# Patient Record
Sex: Female | Born: 1985 | Race: White | Hispanic: No | Marital: Married | Smoking: Never smoker
Health system: Southern US, Community
[De-identification: ages and names within clinical notes are randomized; demographics above are authoritative.]

## PROBLEM LIST (undated history)

## (undated) DIAGNOSIS — D229 Melanocytic nevi, unspecified: Secondary | ICD-10-CM

## (undated) DIAGNOSIS — K5900 Constipation, unspecified: Secondary | ICD-10-CM

## (undated) HISTORY — DX: Melanocytic nevi, unspecified: D22.9

## (undated) HISTORY — DX: Constipation, unspecified: K59.00

## (undated) HISTORY — PX: OTHER SURGICAL HISTORY: SHX169

---

## 2012-06-13 ENCOUNTER — Inpatient Hospital Stay: Payer: Self-pay | Admitting: Obstetrics and Gynecology

## 2012-06-14 LAB — CBC WITH DIFFERENTIAL/PLATELET
Basophil %: 1 %
Eosinophil #: 0 10*3/uL (ref 0.0–0.7)
HCT: 33.3 % — ABNORMAL LOW (ref 35.0–47.0)
HGB: 11.3 g/dL — ABNORMAL LOW (ref 12.0–16.0)
Lymphocyte #: 2 10*3/uL (ref 1.0–3.6)
Lymphocyte %: 23.1 %
MCH: 32.4 pg (ref 26.0–34.0)
MCV: 96 fL (ref 80–100)
Monocyte %: 6.1 %
Neutrophil %: 69.3 %
RDW: 14.1 % (ref 11.5–14.5)
WBC: 8.7 10*3/uL (ref 3.6–11.0)

## 2012-06-15 LAB — HEMATOCRIT: HCT: 27.2 % — ABNORMAL LOW (ref 35.0–47.0)

## 2014-03-06 LAB — HM PAP SMEAR: HM PAP: NEGATIVE

## 2014-10-16 NOTE — H&P (Signed)
L&D Evaluation:  History:   HPI 29 yo wmf G1Po at 40.[redacted] weeks gestation.    Presents with contractions    Patient's Medical History No Chronic Illness    Patient's Surgical History none    Medications Pre [place] Vitamins    Social History none    Family History Non-Contributory   ROS:   ROS All systems were reviewed.  HEENT, CNS, GI, GU, Respiratory, CV, Renal and Musculoskeletal systems were found to be normal.   Exam:   Vital Signs stable    Urine Protein negative dipstick    General no apparent distress    Mental Status clear    Heart normal sinus rhythm    Abdomen gravid, non-tender    Estimated Fetal Weight Average for gestational age, 8#4    Fundal Height 38    Back no CVAT    Edema 1+    Pelvic no external lesions, 4-5/80/0/vtx/BOWI    Mebranes Intact    FHT normal rate with no decels, NST Reactive    Ucx irregular, reguar after cytotec    Skin dry    Lymph no lymphadenopathy   Impression:   Impression early labor, 40.1 week IUP   Plan:   Plan EFM/NST, monitor contractions and for cervical change, Cytotec/Pitocin IOL    Comments Epidural prn   Electronic Signatures: Glenisha Gundry, Alanda Slim (MD)  (Signed 07-Jan-14 08:35)  Authored: L&D Evaluation   Last Updated: 07-Jan-14 08:35 by Clotile Whittington, Alanda Slim (MD)

## 2015-03-13 ENCOUNTER — Encounter: Payer: Self-pay | Admitting: Obstetrics and Gynecology

## 2015-03-13 ENCOUNTER — Ambulatory Visit (INDEPENDENT_AMBULATORY_CARE_PROVIDER_SITE_OTHER): Payer: BLUE CROSS/BLUE SHIELD | Admitting: Obstetrics and Gynecology

## 2015-03-13 VITALS — BP 110/71 | HR 78 | Ht 68.0 in | Wt 181.5 lb

## 2015-03-13 DIAGNOSIS — Z01419 Encounter for gynecological examination (general) (routine) without abnormal findings: Secondary | ICD-10-CM | POA: Diagnosis not present

## 2015-03-13 NOTE — Progress Notes (Signed)
Patient ID: Cheryl Meyer, female   DOB: 1985/07/15, 29 y.o.   MRN: [id number] ANNUAL PREVENTATIVE CARE GYN  ENCOUNTER NOTE  Subjective:       Cheryl Meyer is a 29 y.o. No obstetric history on file. female here for a routine annual gynecologic exam.  Current complaints: 1.  none  2.  Attempting conception   Gynecologic History Patient's last menstrual period was 02/12/2015 (exact date). Contraception: none Last Pap: 03/06/2014 NEG/NEG. Results were: normal Last mammogram: n/a. Results were: n/a  Obstetric History OB History  No data available    Past Medical History  Diagnosis Date  . Numerous moles   . Constipation     Past Surgical History  Procedure Laterality Date  . None      No current outpatient prescriptions on file prior to visit.   No current facility-administered medications on file prior to visit.    No Known Allergies  Social History   Social History  . Marital Status: Married    Spouse Name: N/A  . Number of Children: N/A  . Years of Education: N/A   Occupational History  . Not on file.   Social History Main Topics  . Smoking status: Never Smoker   . Smokeless tobacco: Not on file  . Alcohol Use: Yes     Comment: occas- once a week  . Drug Use: No  . Sexual Activity: Yes    Birth Control/ Protection: None   Other Topics Concern  . Not on file   Social History Narrative  . No narrative on file    Family History  Problem Relation Age of Onset  . Breast cancer Mother   . Diabetes Father   . Breast cancer Maternal Aunt   . Heart disease Maternal Grandfather   . Colon cancer Neg Hx   . Ovarian cancer Neg Hx     The following portions of the patient's history were reviewed and updated as appropriate: allergies, current medications, past family history, past medical history, past social history, past surgical history and problem list.  Review of Systems ROS Review of Systems - General ROS: negative for - chills, fatigue,  fever, hot flashes, night sweats, weight gain or weight loss Psychological ROS: negative for - anxiety, decreased libido, depression, mood swings, physical abuse or sexual abuse Ophthalmic ROS: negative for - blurry vision, eye pain or loss of vision ENT ROS: negative for - headaches, hearing change, visual changes or vocal changes Allergy and Immunology ROS: negative for - hives, itchy/watery eyes or seasonal allergies Hematological and Lymphatic ROS: negative for - bleeding problems, bruising, swollen lymph nodes or weight loss Endocrine ROS: negative for - galactorrhea, hair pattern changes, hot flashes, malaise/lethargy, mood swings, palpitations, polydipsia/polyuria, skin changes, temperature intolerance or unexpected weight changes Breast ROS: negative for - new or changing breast lumps or nipple discharge Respiratory ROS: negative for - cough or shortness of breath Cardiovascular ROS: negative for - chest pain, irregular heartbeat, palpitations or shortness of breath Gastrointestinal ROS: no abdominal pain, change in bowel habits, or black or bloody stools Genito-Urinary ROS: no dysuria, trouble voiding, or hematuria Musculoskeletal ROS: negative for - joint pain or joint stiffness Neurological ROS: negative for - bowel and bladder control changes Dermatological ROS: negative for rash and skin lesion changes   Objective:   BP 110/71 mmHg  Pulse 78  Ht 5\' 8"  (1.727 m)  Wt 181 lb 8 oz (82.328 kg)  BMI 27.60 kg/m2  LMP 02/12/2015 (Exact Date) CONSTITUTIONAL:  Well-developed, well-nourished female in no acute distress.  PSYCHIATRIC: Normal mood and affect. Normal behavior. Normal judgment and thought content. [place]: Alert and oriented to person, place, and time. Normal muscle tone coordination. No cranial nerve deficit noted. HENT:  Normocephalic, atraumatic, External right and left ear normal. Oropharynx is clear and moist EYES: Conjunctivae and EOM are normal. Pupils are equal,  round, and reactive to light. No scleral icterus.  NECK: Normal range of motion, supple, no masses.  Normal thyroid.  SKIN: Skin is warm and dry. No rash noted. Not diaphoretic. No erythema. No pallor. CARDIOVASCULAR: Normal heart rate noted, regular rhythm, no murmur. RESPIRATORY: Clear to auscultation bilaterally. Effort and breath sounds normal, no problems with respiration noted. BREASTS: Symmetric in size. No masses, skin changes, nipple drainage, or lymphadenopathy. ABDOMEN: Soft, normal bowel sounds, no distention noted.  No tenderness, rebound or guarding.  BLADDER: Normal PELVIC:  External Genitalia: Normal  BUS: Normal  Vagina: Normal  Cervix: Normal  Uterus: Normal; retroverted; normal size, shape  Adnexa: Normal  RV: External Exam NormaI  MUSCULOSKELETAL: Normal range of motion. No tenderness.  No cyanosis, clubbing, or edema.  2+ distal pulses. LYMPHATIC: No Axillary, Supraclavicular, or Inguinal Adenopathy.    Assessment:   Annual gynecologic examination 29 y.o. Contraception: none Overweight Problem List Items Addressed This Visit    None      Plan:  Pap: Not needed Mammogram: Not Indicated Stool Guaiac Testing:  Not Indicated Labs: lipid vit d tsh fbs a1c Routine preventative health maintenance measures emphasized: Exercise/Diet/Weight control, Tobacco Warnings and Alcohol/Substance use risks Continue prenatal vitamins; preconception counseling done.  Exercise/Diet/Weight control, Tobacco Warnings and Alcohol/Substance use risks discussed. Return to [place], [place]  Brayton Mars, MD

## 2015-03-13 NOTE — Patient Instructions (Signed)
1.  Pap smear not needed. 2.  Mammogram not needed. 3.  Continue with prenatal vitamins. 4.  Preconception counseling regarding healthy eating, exercise, avoidance of alcohol/tobacco/drugs. 5.  Return in 1 year or as needed

## 2015-03-14 LAB — GLUCOSE, RANDOM: Glucose: 84 mg/dL (ref 65–99)

## 2015-03-14 LAB — TSH: TSH: 2.86 u[IU]/mL (ref 0.450–4.500)

## 2015-03-14 LAB — HEMOGLOBIN A1C
Est. average glucose Bld gHb Est-mCnc: 108 mg/dL
Hgb A1c MFr Bld: 5.4 % (ref 4.8–5.6)

## 2015-03-14 LAB — LIPID PANEL
CHOL/HDL RATIO: 3.2 ratio (ref 0.0–4.4)
Cholesterol, Total: 165 mg/dL (ref 100–199)
HDL: 52 mg/dL (ref >39)
LDL CALC: 100 mg/dL — AB (ref 0–99)
Triglycerides: 67 mg/dL (ref 0–149)
VLDL CHOLESTEROL CAL: 13 mg/dL (ref 5–40)

## 2015-03-14 LAB — VITAMIN D 25 HYDROXY (VIT D DEFICIENCY, FRACTURES): VIT D 25 HYDROXY: 30.7 ng/mL (ref 30.0–100.0)

## 2015-06-09 NOTE — L&D Delivery Note (Signed)
Delivery Note At  1459 a viable and healthy female was delivered via  (Presentation:OA ;  ).  APGAR:8 ,9 ; weight  .   Placenta status: delivered intact with 3 vessel  Cord:  with the following complications: Logan [id number] reduced on perineum   Anesthesia:  epidural Episiotomy:  midline Lacerations:  none Suture Repair: 3.0 vicryl rapide Est. Blood Loss (mL):  300  Mom to postpartum.  Baby to Couplet care / Skin to Skin.  [place], CNM 02/10/2016, 3:14 PM

## 2015-06-21 ENCOUNTER — Encounter: Payer: Self-pay | Admitting: Obstetrics and Gynecology

## 2015-06-21 ENCOUNTER — Ambulatory Visit (INDEPENDENT_AMBULATORY_CARE_PROVIDER_SITE_OTHER): Payer: BLUE CROSS/BLUE SHIELD | Admitting: Obstetrics and Gynecology

## 2015-06-21 VITALS — BP 138/78 | HR 80 | Ht 68.0 in | Wt 185.3 lb

## 2015-06-21 DIAGNOSIS — N926 Irregular menstruation, unspecified: Secondary | ICD-10-CM

## 2015-06-21 LAB — POCT URINE PREGNANCY: Preg Test, Ur: POSITIVE — AB

## 2015-06-21 NOTE — Patient Instructions (Signed)
Thank you for enrolling in [facility]. Please follow the instructions below to securely access your online medical record. MyChart allows you to send messages to your doctor, view your test results, renew your prescriptions, schedule appointments, and more.  How Do I Sign Up? 1. In your Internet browser, go to [email or web address]. 2. Click on the New  User? link in the Sign In box.  3. Enter your MyChart Access Code exactly as it appears below. You will not need to use this code after you have completed the sign-up process. If you do not sign up before the expiration date, you must request a new code. MyChart Access Code: [email or web address] Expires: 08/17/2015  2:19 PM  4. Enter the last four digits of your Social Security Number (xxxx) and Date of Birth (mm/dd/yyyy) as indicated and click Next. You will be taken to the next sign-up page. 5. Create a MyChart ID. This will be your MyChart login ID and cannot be changed, so think of one that is secure and easy to remember. 6. Create a MyChart password. You can change your password at any time. 7. Enter your Password Reset Question and Answer and click Next. This can be used at a later time if you forget your password.  8. Select your communication preference, and if applicable enter your e-mail address. You will receive e-mail notification when new information is available in MyChart by choosing to receive e-mail notifications and filling in your e-mail. 9. Click Sign In. You can now view your medical record.   Additional Information If you have questions, you can email [email or web address] or call [phone] to talk to our [facility] staff. Remember, MyChart is NOT to be used for urgent needs. For medical emergencies, dial 911.   First Trimester of Pregnancy The first trimester of pregnancy is from week 1 until the end of week 12 (months 1 through 3). A week after a sperm fertilizes an egg, the egg will implant on the wall  of the uterus. This embryo will begin to develop into a baby. Genes from you and your partner are forming the baby. The female genes determine whether the baby is a boy or a girl. At 6-8 weeks, the eyes and face are formed, and the heartbeat can be seen on ultrasound. At the end of 12 weeks, all the baby's organs are formed.  Now that you are pregnant, you will want to do everything you can to have a healthy baby. Two of the most important things are to get good prenatal care and to follow your health care provider's instructions. Prenatal care is all the medical care you receive before the baby's birth. This care will help prevent, find, and treat any problems during the pregnancy and childbirth. BODY CHANGES Your body goes through many changes during pregnancy. The changes vary from woman to woman.   You may gain or lose a couple of pounds at first.  You may feel sick to your stomach (nauseous) and throw up (vomit). If the vomiting is uncontrollable, call your health care provider.  You may tire easily.  You may develop headaches that can be relieved by medicines approved by your health care provider.  You may urinate more often. Painful urination may mean you have a bladder infection.  You may develop heartburn as a result of your pregnancy.  You may develop constipation because certain hormones are causing the muscles that push waste through your intestines to slow down.  You may develop  hemorrhoids or swollen, bulging veins (varicose veins).  Your breasts may begin to grow larger and become tender. Your nipples may stick out more, and the tissue that surrounds them (areola) may become darker.  Your gums may bleed and may be sensitive to brushing and flossing.  Dark spots or blotches (chloasma, mask of pregnancy) may develop on your face. This will likely fade after the baby is born.  Your menstrual periods will stop.  You may have a loss of appetite.  You may develop cravings for  certain kinds of food.  You may have changes in your emotions from day to day, such as being excited to be pregnant or being concerned that something may go wrong with the pregnancy and baby.  You may have more vivid and strange dreams.  You may have changes in your hair. These can include thickening of your hair, rapid growth, and changes in texture. Some women also have hair loss during or after pregnancy, or hair that feels dry or thin. Your hair will most likely return to normal after your baby is born. WHAT TO EXPECT AT YOUR PRENATAL VISITS During a routine prenatal visit:  You will be weighed to make sure you and the baby are growing normally.  Your blood pressure will be taken.  Your abdomen will be measured to track your baby's growth.  The fetal heartbeat will be listened to starting around week 10 or 12 of your pregnancy.  Test results from any previous visits will be discussed. Your health care provider may ask you:  How you are feeling.  If you are feeling the baby move.  If you have had any abnormal symptoms, such as leaking fluid, bleeding, severe headaches, or abdominal cramping.  If you are using any tobacco products, including cigarettes, chewing tobacco, and electronic cigarettes.  If you have any questions. Other tests that may be performed during your first trimester include:  Blood tests to find your blood type and to check for the presence of any previous infections. They will also be used to check for low iron levels (anemia) and Rh antibodies. Later in the pregnancy, blood tests for diabetes will be done along with other tests if problems develop.  Urine tests to check for infections, diabetes, or protein in the urine.  An ultrasound to confirm the proper growth and development of the baby.  An amniocentesis to check for possible genetic problems.  Fetal screens for spina bifida and Down syndrome.  You may need other tests to make sure you and the  baby are doing well.  HIV (human immunodeficiency virus) testing. Routine prenatal testing includes screening for HIV, unless you choose not to have this test. HOME CARE INSTRUCTIONS  Medicines  Follow your health care provider's instructions regarding medicine use. Specific medicines may be either safe or unsafe to take during pregnancy.  Take your prenatal vitamins as directed.  If you develop constipation, try taking a stool softener if your health care provider approves. Diet  Eat regular, well-balanced meals. Choose a variety of foods, such as meat or vegetable-based protein, fish, milk and low-fat dairy products, vegetables, fruits, and whole grain breads and cereals. Your health care provider will help you determine the amount of weight gain that is right for you.  Avoid raw meat and uncooked cheese. These carry germs that can cause birth defects in the baby.  Eating four or five small meals rather than three large meals a day may help relieve nausea and vomiting. If  you start to feel nauseous, eating a few soda crackers can be helpful. Drinking liquids between meals instead of during meals also seems to help nausea and vomiting.  If you develop constipation, eat more high-fiber foods, such as fresh vegetables or fruit and whole grains. Drink enough fluids to keep your urine clear or pale yellow. Activity and Exercise  Exercise only as directed by your health care provider. Exercising will help you:  Control your weight.  Stay in shape.  Be prepared for labor and delivery.  Experiencing pain or cramping in the lower abdomen or low back is a good sign that you should stop exercising. Check with your health care provider before continuing normal exercises.  Try to avoid standing for long periods of time. Move your legs often if you must stand in one place for a long time.  Avoid heavy lifting.  Wear low-heeled shoes, and practice good posture.  You may continue to have sex  unless your health care provider directs you otherwise. Relief of Pain or Discomfort  Wear a good support bra for breast tenderness.   Take warm sitz baths to soothe any pain or discomfort caused by hemorrhoids. Use hemorrhoid cream if your health care provider approves.   Rest with your legs elevated if you have leg cramps or low back pain.  If you develop varicose veins in your legs, wear support hose. Elevate your feet for 15 minutes, 3-4 times a day. Limit salt in your diet. Prenatal Care  Schedule your prenatal visits by the twelfth week of pregnancy. They are usually scheduled monthly at first, then more often in the last 2 months before delivery.  Write down your questions. Take them to your prenatal visits.  Keep all your prenatal visits as directed by your health care provider. Safety  Wear your seat belt at all times when driving.  Make a list of emergency phone numbers, including numbers for family, friends, the hospital, and police and fire departments. General Tips  Ask your health care provider for a referral to a local prenatal education class. Begin classes no later than at the beginning of month 6 of your pregnancy.  Ask for help if you have counseling or nutritional needs during pregnancy. Your health care provider can offer advice or refer you to specialists for help with various needs.  Do not use hot tubs, steam rooms, or saunas.  Do not douche or use tampons or scented sanitary pads.  Do not cross your legs for long periods of time.  Avoid cat litter boxes and soil used by cats. These carry germs that can cause birth defects in the baby and possibly loss of the fetus by miscarriage or stillbirth.  Avoid all smoking, herbs, alcohol, and medicines not prescribed by your health care provider. Chemicals in these affect the formation and growth of the baby.  Do not use any tobacco products, including cigarettes, chewing tobacco, and electronic cigarettes. If  you need help quitting, ask your health care provider. You may receive counseling support and other resources to help you quit.  Schedule a dentist appointment. At home, brush your teeth with a soft toothbrush and be gentle when you floss. SEEK MEDICAL CARE IF:   You have dizziness.  You have mild pelvic cramps, pelvic pressure, or nagging pain in the abdominal area.  You have persistent nausea, vomiting, or diarrhea.  You have a bad smelling vaginal discharge.  You have pain with urination.  You notice increased swelling in your face,  hands, legs, or ankles. SEEK IMMEDIATE MEDICAL CARE IF:   You have a fever.  You are leaking fluid from your vagina.  You have spotting or bleeding from your vagina.  You have severe abdominal cramping or pain.  You have rapid weight gain or loss.  You vomit blood or material that looks like coffee grounds.  You are exposed to [place] measles and have never had them.  You are exposed to fifth disease or chickenpox.  You develop a severe headache.  You have shortness of breath.  You have any kind of trauma, such as from a fall or a car accident.   This information is not intended to replace advice given to you by your health care provider. Make sure you discuss any questions you have with your health care provider.   Document Released: 05/19/2001 Document Revised: 06/15/2014 Document Reviewed: 04/04/2013 Elsevier Interactive Patient Education [employer].

## 2015-06-21 NOTE — Progress Notes (Signed)
Patient ID: Cheryl Meyer, female   DOB: 1986-04-26, 30 y.o.   MRN: [id number]   Here for pregnancy confirmation: LMP 05/27/15 with [facility] 02/18/16 and EGA [redacted]w[redacted]d  Denies any nausea, happy about planned pregnancy. G2P1001 with normal previous pregnancy and delivery.  A: missed menses with +UPT  P: viability scan in 3 weeks with Nurse intake and labs.  NOB PE in 6 weeks.  Cristen Murcia Carver, CNM

## 2015-06-24 ENCOUNTER — Encounter: Payer: Self-pay | Admitting: Obstetrics and Gynecology

## 2015-07-19 ENCOUNTER — Ambulatory Visit (INDEPENDENT_AMBULATORY_CARE_PROVIDER_SITE_OTHER): Payer: BLUE CROSS/BLUE SHIELD | Admitting: Obstetrics and Gynecology

## 2015-07-19 ENCOUNTER — Ambulatory Visit (INDEPENDENT_AMBULATORY_CARE_PROVIDER_SITE_OTHER): Payer: BLUE CROSS/BLUE SHIELD

## 2015-07-19 VITALS — BP 122/82 | HR 70 | Wt 185.1 lb

## 2015-07-19 DIAGNOSIS — Z3490 Encounter for supervision of normal pregnancy, unspecified, unspecified trimester: Secondary | ICD-10-CM

## 2015-07-19 DIAGNOSIS — N926 Irregular menstruation, unspecified: Secondary | ICD-10-CM

## 2015-07-19 DIAGNOSIS — Z331 Pregnant state, incidental: Secondary | ICD-10-CM

## 2015-07-19 DIAGNOSIS — Z36 Encounter for antenatal screening of mother: Secondary | ICD-10-CM

## 2015-07-19 DIAGNOSIS — Z113 Encounter for screening for infections with a predominantly sexual mode of transmission: Secondary | ICD-10-CM

## 2015-07-19 DIAGNOSIS — Z1389 Encounter for screening for other disorder: Secondary | ICD-10-CM

## 2015-07-19 DIAGNOSIS — R638 Other symptoms and signs concerning food and fluid intake: Secondary | ICD-10-CM

## 2015-07-19 DIAGNOSIS — Z369 Encounter for antenatal screening, unspecified: Secondary | ICD-10-CM

## 2015-07-19 NOTE — Progress Notes (Signed)
  Cheryl Meyer presents for NOB nurse interview visit. G-2.  P-1001. Pregnancy education material explained and given. No cats in the home. NOB labs ordered. TSH/HbgA1c due to Increased BMI, HIV labs and Drug screen were explained optional and she could opt out of tests but did not decline. Drug screen ordered. PNV encouraged. NT to discuss with provider. Pt. To follow up with provider in 3 weeks for NOB physical.  All questions answered.  ZIKA EXPOSURE SCREEN:  The patient has not traveled to a [place] Virus endemic area within the past 6 months, nor has she had unprotected sex with a partner who has travelled to a [place] endemic region within the past 6 months. The patient has been advised to notify us if these factors change any time during this current pregnancy, so adequate testing and monitoring can be initiated.

## 2015-07-19 NOTE — Patient Instructions (Signed)
Pregnancy and Zika Virus Disease Zika virus disease, or Zika, is an illness that can spread to people from mosquitoes that carry the virus. It may also spread from person to person through infected body fluids. Zika first occurred in [place], but recently it has spread to new areas. The virus occurs in tropical climates. The location of Zika continues to change. Most people who become infected with Zika virus do not develop serious illness. However, Zika may cause birth defects in an unborn baby whose mother is infected with the virus. It may also increase the risk of miscarriage. WHAT ARE THE SYMPTOMS OF ZIKA VIRUS DISEASE? In many cases, people who have been infected with Zika virus do not develop any symptoms. If symptoms appear, they usually start about a week after the person is infected. Symptoms are usually mild. They may include:  Fever.  Rash.  Red eyes.  Joint pain. HOW DOES ZIKA VIRUS DISEASE SPREAD? The main way that Zika virus spreads is through the bite of a certain type of mosquito. Unlike most types of mosquitos, which bite only at night, the type of mosquito that carries Zika virus bites both at night and during the day. Zika virus can also spread through sexual contact, through a blood transfusion, and from a mother to her baby before or during birth. Once you have had Zika virus disease, it is unlikely that you will get it again. CAN I PASS ZIKA TO MY BABY DURING PREGNANCY? Yes, Zika can pass from a mother to her baby before or during birth. WHAT PROBLEMS CAN ZIKA CAUSE FOR MY BABY? A woman who is infected with Zika virus while pregnant is at risk of having her baby born with a condition in which the brain or head is smaller than expected (microcephaly). Babies who have microcephaly can have developmental delays, seizures, hearing problems, and vision problems. Having Zika virus disease during pregnancy can also increase the risk of miscarriage. HOW CAN ZIKA VIRUS DISEASE BE  PREVENTED? There is no vaccine to prevent Zika. The best way to prevent the disease is to avoid infected mosquitoes and avoid exposure to body fluids that can spread the virus. Avoid any possible exposure to Zika by taking the following precautions. For women and their sex partners:  Avoid traveling to high-risk areas. The locations where Zika is being reported change often. To identify high-risk areas, check the CDC travel website: www.cdc.gov/zika/geo/index.html  If you or your sex partner must travel to a high-risk area, talk with a health care provider before and after traveling.  Take all precautions to avoid mosquito bites if you live in, or travel to, any of the high-risk areas. Insect repellents are safe to use during pregnancy.  Ask your health care provider when it is safe to have sexual contact. For women:  If you are pregnant or trying to become pregnant, avoid sexual contact with persons who may have been exposed to Zika virus, persons who have possible symptoms of Zika, or persons whose history you are unsure about. If you choose to have sexual contact with someone who may have been exposed to Zika virus, use condoms correctly during the entire duration of sexual activity, every time. Do not share sexual devices, as you may be exposed to body fluids.  Ask your health care provider about when it is safe to attempt pregnancy after a possible exposure to Zika virus. WHAT STEPS SHOULD I TAKE TO AVOID MOSQUITO BITES? Take these steps to avoid mosquito bites when you are   in a high-risk area:  Wear loose clothing that covers your arms and legs.  Limit your outdoor activities.  Do not open windows unless they have window screens.  Sleep under mosquito nets.  Use insect repellent. The best insect repellents have:  DEET, picaridin, oil of lemon eucalyptus (OLE), or IR3535 in them.  Higher amounts of an active ingredient in them.  Remember that insect repellents are safe to use  during pregnancy.  Do not use OLE on children who are younger than 3 years of age. Do not use insect repellent on babies who are younger than 2 months of age.  Cover your child's stroller with mosquito netting. Make sure the netting fits snugly and that any loose netting does not cover your child's mouth or nose. Do not use a blanket as a mosquito-protection cover.  Do not apply insect repellent underneath clothing.  If you are using sunscreen, apply the sunscreen before applying the insect repellent.  Treat clothing with permethrin. Do not apply permethrin directly to your skin. Follow label directions for safe use.  Get rid of standing water, where mosquitoes may reproduce. Standing water is often found in items such as buckets, bowls, animal food dishes, and flowerpots. When you return from traveling to any high-risk area, continue taking actions to protect yourself against mosquito bites for 3 weeks, even if you show no signs of illness. This will prevent spreading Zika virus to uninfected mosquitoes. WHAT SHOULD I KNOW ABOUT THE SEXUAL TRANSMISSION OF ZIKA? People can spread Zika to their sexual partners during vaginal, anal, or oral sex, or by sharing sexual devices. Many people with Zika do not develop symptoms, so a person could spread the disease without knowing that they are infected. The greatest risk is to women who are pregnant or who may become pregnant. Zika virus can live longer in semen than it can live in blood. Couples can prevent sexual transmission of the virus by:  Using condoms correctly during the entire duration of sexual activity, every time. This includes vaginal, anal, and oral sex.  Not sharing sexual devices. Sharing increases your risk of being exposed to body fluid from another person.  Avoiding all sexual activity until your health care provider says it is safe. SHOULD I BE TESTED FOR ZIKA VIRUS? A sample of your blood can be tested for Zika virus. A pregnant  woman should be tested if she may have been exposed to the virus or if she has symptoms of Zika. She may also have additional tests done during her pregnancy, such ultrasound testing. Talk with your health care provider about which tests are recommended.   This information is not intended to replace advice given to you by your health care provider. Make sure you discuss any questions you have with your health care provider.   Document Released: 02/13/2015 Document Reviewed: 02/06/2015 Elsevier Interactive Patient Education 2016 [employer] Minor Illnesses and Medications in Pregnancy  Cold/Flu:  Sudafed for congestion- Robitussin (plain) for cough- Tylenol for discomfort.  Please follow the directions on the label.  Try not to take any more than needed.  OTC Saline nasal spray and air humidifier or cool-mist  Vaporizer to sooth nasal irritation and to loosen congestion.  It is also important to increase intake of non carbonated fluids, especially if you have a fever.  Constipation:  Colace-2 capsules at bedtime; Metamucil- follow directions on label; Senokot- 1 tablet at bedtime.  Any one of these medications can be used.  It is also   very important to increase fluids and fruits along with regular exercise.  If problem persists please call the office.  Diarrhea:  Kaopectate as directed on the label.  Eat a bland diet and increase fluids.  Avoid highly seasoned foods.  Headache:  Tylenol 1 or 2 tablets every 3-4 hours as needed  Indigestion:  Maalox, Mylanta, Tums or Rolaids- as directed on label.  Also try to eat small meals and avoid fatty, greasy or spicy foods.  Nausea with or without Vomiting:  Nausea in pregnancy is caused by increased levels of hormones in the body which influence the digestive system and cause irritation when stomach acids accumulate.  Symptoms usually subside after 1st trimester of pregnancy.  Try the following:  Keep saltines, graham crackers or dry toast by your bed  to eat upon awakening.  Don't let your stomach get empty.  Try to eat 5-6 small meals per day instead of 3 large ones.  Avoid greasy fatty or highly seasoned foods.   Take OTC Unisom 1 tablet at bed time along with OTC Vitamin B6 25-50 mg 3 times per day.    If nausea continues with vomiting and you are unable to keep down food and fluids you may need a prescription medication.  Please notify your provider.   Sore throat:  Chloraseptic spray, throat lozenges and or plain Tylenol.  Vaginal Yeast Infection:  OTC Monistat for 7 days as directed on label.  If symptoms do not resolve within a week notify provider.  If any of the above problems do not subside with recommended treatment please call the office for further assistance.   Do not take Aspirin, Advil, Motrin or Ibuprofen.  * * OTC= Over the counter Hyperemesis Gravidarum Hyperemesis gravidarum is a severe form of nausea and vomiting that happens during pregnancy. Hyperemesis is worse than morning sickness. It may cause you to have nausea or vomiting all day for many days. It may keep you from eating and drinking enough food and liquids. Hyperemesis usually occurs during the first half (the first 20 weeks) of pregnancy. It often goes away once a woman is in her second half of pregnancy. However, sometimes hyperemesis continues through an entire pregnancy.  CAUSES  The cause of this condition is not completely known but is thought to be related to changes in the body's hormones when pregnant. It could be from the high level of the pregnancy hormone or an increase in estrogen in the body.  SIGNS AND SYMPTOMS   Severe nausea and vomiting.  Nausea that does not go away.  Vomiting that does not allow you to keep any food down.  Weight loss and body fluid loss (dehydration).  Having no desire to eat or not liking food you have previously enjoyed. DIAGNOSIS  Your health care provider will do a physical exam and ask you about your symptoms.  He or she may also order blood tests and urine tests to make sure something else is not causing the problem.  TREATMENT  You may only need medicine to control the problem. If medicines do not control the nausea and vomiting, you will be treated in the hospital to prevent dehydration, increased acid in the blood (acidosis), weight loss, and changes in the electrolytes in your body that may harm the unborn baby (fetus). You may need IV fluids.  HOME CARE INSTRUCTIONS   Only take over-the-counter or prescription medicines as directed by your health care provider.  Try eating a couple of dry crackers or   toast in the morning before getting out of bed.  Avoid foods and smells that upset your stomach.  Avoid fatty and spicy foods.  Eat 5-6 small meals a day.  Do not drink when eating meals. Drink between meals.  For snacks, eat high-protein foods, such as cheese.  Eat or suck on things that have ginger in them. Ginger helps nausea.  Avoid food preparation. The smell of food can spoil your appetite.  Avoid iron pills and iron in your multivitamins until after 3-4 months of being pregnant. However, consult with your health care provider before stopping any prescribed iron pills. SEEK MEDICAL CARE IF:   Your abdominal pain increases.  You have a severe headache.  You have vision problems.  You are losing weight. SEEK IMMEDIATE MEDICAL CARE IF:   You are unable to keep fluids down.  You vomit blood.  You have constant nausea and vomiting.  You have excessive weakness.  You have extreme thirst.  You have dizziness or fainting.  You have a fever or persistent symptoms for more than 2-3 days.  You have a fever and your symptoms suddenly get worse. MAKE SURE YOU:   Understand these instructions.  Will watch your condition.  Will get help right away if you are not doing well or get worse.   This information is not intended to replace advice given to you by your health care  provider. Make sure you discuss any questions you have with your health care provider.   Document Released: 05/25/2005 Document Revised: 03/15/2013 Document Reviewed: 01/04/2013 Elsevier Interactive Patient Education 2016 [employer]. Commonly Asked Questions During Pregnancy  Cats: A parasite can be excreted in cat feces.  To avoid exposure you need to have another person empty the little box.  If you must empty the litter box you will need to wear gloves.  Wash your hands after handling your cat.  This parasite can also be found in raw or undercooked meat so this should also be avoided.  Colds, Sore Throats, Flu: Please check your medication sheet to see what you can take for symptoms.  If your symptoms are unrelieved by these medications please call the office.  Dental Work: Most any dental work your dentist recommends is permitted.  X-rays should only be taken during the first trimester if absolutely necessary.  Your abdomen should be shielded with a lead apron during all x-rays.  Please notify your provider prior to receiving any x-rays.  Novocaine is fine; gas is not recommended.  If your dentist requires a note from us prior to dental work please call the office and we will provide one for you.  Exercise: Exercise is an important part of staying healthy during your pregnancy.  You may continue most exercises you were accustomed to prior to pregnancy.  Later in your pregnancy you will most likely notice you have difficulty with activities requiring balance like riding a bicycle.  It is important that you listen to your body and avoid activities that put you at a higher risk of falling.  Adequate rest and staying well hydrated are a must!  If you have questions about the safety of specific activities ask your provider.    Exposure to Children with illness: Try to avoid obvious exposure; report any symptoms to us when noted,  If you have chicken pos, red measles or mumps, you should be immune to  these diseases.   Please do not take any vaccines while pregnant unless you have checked with   your OB provider.  Fetal Movement: After 28 weeks we recommend you do "kick counts" twice daily.  Lie or sit down in a calm quiet environment and count your baby movements "kicks".  You should feel your baby at least 10 times per hour.  If you have not felt 10 kicks within the first hour get up, walk around and have something sweet to eat or drink then repeat for an additional hour.  If count remains less than 10 per hour notify your provider.  Fumigating: Follow your pest control agent's advice as to how long to stay out of your home.  Ventilate the area well before re-entering.  Hemorrhoids:   Most over-the-counter preparations can be used during pregnancy.  Check your medication to see what is safe to use.  It is important to use a stool softener or fiber in your diet and to drink lots of liquids.  If hemorrhoids seem to be getting worse please call the office.   Hot Tubs:  Hot tubs Jacuzzis and saunas are not recommended while pregnant.  These increase your internal body temperature and should be avoided.  Intercourse:  Sexual intercourse is safe during pregnancy as long as you are comfortable, unless otherwise advised by your provider.  Spotting may occur after intercourse; report any bright red bleeding that is heavier than spotting.  Labor:  If you know that you are in labor, please go to the hospital.  If you are unsure, please call the office and let us help you decide what to do.  Lifting, straining, etc:  If your job requires heavy lifting or straining please check with your provider for any limitations.  Generally, you should not lift items heavier than that you can lift simply with your hands and arms (no back muscles)  Painting:  Paint fumes do not harm your pregnancy, but may make you ill and should be avoided if possible.  Latex or water based paints have less odor than oils.  Use adequate  ventilation while painting.  Permanents & Hair Color:  Chemicals in hair dyes are not recommended as they cause increase hair dryness which can increase hair loss during pregnancy.  " Highlighting" and permanents are allowed.  Dye may be absorbed differently and permanents may not hold as well during pregnancy.  Sunbathing:  Use a sunscreen, as skin burns easily during pregnancy.  Drink plenty of fluids; avoid over heating.  Tanning Beds:  Because their possible side effects are still unknown, tanning beds are not recommended.  Ultrasound Scans:  Routine ultrasounds are performed at approximately 20 weeks.  You will be able to see your baby's general anatomy an if you would like to know the gender this can usually be determined as well.  If it is questionable when you conceived you may also receive an ultrasound early in your pregnancy for dating purposes.  Otherwise ultrasound exams are not routinely performed unless there is a medical necessity.  Although you can request a scan we ask that you pay for it when conducted because insurance does not cover " patient request" scans.  Work: If your pregnancy proceeds without complications you may work until your due date, unless your physician or employer advises otherwise.  Round Ligament Pain/Pelvic Discomfort:  Sharp, shooting pains not associated with bleeding are fairly common, usually occurring in the second trimester of pregnancy.  They tend to be worse when standing up or when you remain standing for long periods of time.  These are the result   of pressure of certain pelvic ligaments called "round ligaments".  Rest, Tylenol and heat seem to be the most effective relief.  As the womb and fetus grow, they rise out of the pelvis and the discomfort improves.  Please notify the office if your pain seems different than that described.  It may represent a more serious condition.   

## 2015-07-20 LAB — PAIN MGT SCRN (14 DRUGS), UR
Amphetamine Screen, Ur: NEGATIVE ng/mL
BARBITURATE SCRN UR: NEGATIVE ng/mL
BUPRENORPHINE, URINE: NEGATIVE ng/mL
Benzodiazepine Screen, Urine: NEGATIVE ng/mL
COCAINE(METAB.) SCREEN, URINE: NEGATIVE ng/mL
CREATININE(CRT), U: 155 mg/dL (ref 20.0–300.0)
Cannabinoids Ur Ql Scn: NEGATIVE ng/mL
Fentanyl, Urine: NEGATIVE pg/mL
MEPERIDINE SCREEN, URINE: NEGATIVE ng/mL
METHADONE SCREEN, URINE: NEGATIVE ng/mL
OPIATE SCRN UR: NEGATIVE ng/mL
OXYCODONE+OXYMORPHONE UR QL SCN: NEGATIVE ng/mL
PCP SCRN UR: NEGATIVE ng/mL
Ph of Urine: 6.5 (ref 4.5–8.9)
Propoxyphene, Screen: NEGATIVE ng/mL
TRAMADOL UR QL SCN: NEGATIVE ng/mL

## 2015-07-20 LAB — URINALYSIS, ROUTINE W REFLEX MICROSCOPIC
Bilirubin, UA: NEGATIVE
Glucose, UA: NEGATIVE
Ketones, UA: NEGATIVE
LEUKOCYTES UA: NEGATIVE
NITRITE UA: NEGATIVE
PH UA: 6.5 (ref 5.0–7.5)
Protein, UA: NEGATIVE
RBC, UA: NEGATIVE
SPEC GRAV UA: 1.023 (ref 1.005–1.030)
Urobilinogen, Ur: 0.2 mg/dL (ref 0.2–1.0)

## 2015-07-20 LAB — NICOTINE SCREEN, URINE: COTININE UR QL SCN: NEGATIVE ng/mL

## 2015-07-21 LAB — URINE CULTURE, OB REFLEX

## 2015-07-21 LAB — CULTURE, OB URINE

## 2015-07-22 LAB — CBC WITH DIFFERENTIAL/PLATELET
BASOS: 0 %
Basophils Absolute: 0 10*3/uL (ref 0.0–0.2)
EOS (ABSOLUTE): 0 10*3/uL (ref 0.0–0.4)
EOS: 1 %
HEMATOCRIT: 36.7 % (ref 34.0–46.6)
Hemoglobin: 12.8 g/dL (ref 11.1–15.9)
IMMATURE GRANULOCYTES: 1 %
Immature Grans (Abs): 0 10*3/uL (ref 0.0–0.1)
LYMPHS ABS: 1.9 10*3/uL (ref 0.7–3.1)
Lymphs: 28 %
MCH: 31.7 pg (ref 26.6–33.0)
MCHC: 34.9 g/dL (ref 31.5–35.7)
MCV: 91 fL (ref 79–97)
MONOS ABS: 0.3 10*3/uL (ref 0.1–0.9)
Monocytes: 4 %
NEUTROS ABS: 4.5 10*3/uL (ref 1.4–7.0)
NEUTROS PCT: 66 %
Platelets: 241 10*3/uL (ref 150–379)
RBC: 4.04 x10E6/uL (ref 3.77–5.28)
RDW: 12.6 % (ref 12.3–15.4)
WBC: 6.8 10*3/uL (ref 3.4–10.8)

## 2015-07-22 LAB — VARICELLA ZOSTER ANTIBODY, IGM: Varicella IgM: <0.91 index (ref 0.00–0.90)

## 2015-07-22 LAB — RUBELLA ANTIBODY, IGM: Rubella IgM: <20 AU/mL (ref 0.0–19.9)

## 2015-07-22 LAB — HEPATITIS B SURFACE ANTIGEN: Hepatitis B Surface Ag: NEGATIVE

## 2015-07-22 LAB — TSH: TSH: 2.78 u[IU]/mL (ref 0.450–4.500)

## 2015-07-22 LAB — RPR: RPR Ser Ql: NONREACTIVE

## 2015-07-22 LAB — ANTIBODY SCREEN: Antibody Screen: NEGATIVE

## 2015-07-22 LAB — ABO

## 2015-07-22 LAB — HIV ANTIBODY (ROUTINE TESTING W REFLEX): HIV Screen 4th Generation wRfx: NONREACTIVE

## 2015-07-22 LAB — HEMOGLOBIN A1C
ESTIMATED AVERAGE GLUCOSE: 100 mg/dL
HEMOGLOBIN A1C: 5.1 % (ref 4.8–5.6)

## 2015-07-22 LAB — RH TYPE: RH TYPE: POSITIVE

## 2015-07-23 ENCOUNTER — Ambulatory Visit: Payer: BLUE CROSS/BLUE SHIELD

## 2015-07-23 LAB — GC/CHLAMYDIA PROBE AMP
Chlamydia trachomatis, NAA: NEGATIVE
NEISSERIA GONORRHOEAE BY PCR: NEGATIVE

## 2015-08-09 ENCOUNTER — Encounter: Payer: Self-pay | Admitting: Obstetrics and Gynecology

## 2015-08-09 ENCOUNTER — Ambulatory Visit (INDEPENDENT_AMBULATORY_CARE_PROVIDER_SITE_OTHER): Payer: BLUE CROSS/BLUE SHIELD | Admitting: Obstetrics and Gynecology

## 2015-08-09 VITALS — BP 140/68 | HR 75 | Wt 188.2 lb

## 2015-08-09 DIAGNOSIS — Z789 Other specified health status: Secondary | ICD-10-CM

## 2015-08-09 DIAGNOSIS — O09899 Supervision of other high risk pregnancies, unspecified trimester: Secondary | ICD-10-CM

## 2015-08-09 DIAGNOSIS — Z283 Underimmunization status: Secondary | ICD-10-CM

## 2015-08-09 DIAGNOSIS — O9989 Other specified diseases and conditions complicating pregnancy, childbirth and the puerperium: Secondary | ICD-10-CM

## 2015-08-09 DIAGNOSIS — Z2839 Other underimmunization status: Secondary | ICD-10-CM | POA: Insufficient documentation

## 2015-08-09 DIAGNOSIS — Z331 Pregnant state, incidental: Secondary | ICD-10-CM

## 2015-08-09 DIAGNOSIS — E663 Overweight: Secondary | ICD-10-CM | POA: Insufficient documentation

## 2015-08-09 LAB — POCT URINALYSIS DIPSTICK
Bilirubin, UA: NEGATIVE
Blood, UA: NEGATIVE
Glucose, UA: NEGATIVE
Ketones, UA: NEGATIVE
Leukocytes, UA: NEGATIVE
Nitrite, UA: NEGATIVE
Protein, UA: NEGATIVE
Spec Grav, UA: 1.01
Urobilinogen, UA: 0.2
pH, UA: 6.5

## 2015-08-09 NOTE — Progress Notes (Signed)
NOB- doing well, no concerns; desires genetic screening- drawn today, routine care discussed, PE declined as she just had her physical.

## 2015-08-09 NOTE — Patient Instructions (Signed)

## 2015-08-09 NOTE — Progress Notes (Signed)
Cheryl Meyer is feeling good, denies any complaints

## 2015-08-15 ENCOUNTER — Encounter: Payer: Self-pay | Admitting: Obstetrics and Gynecology

## 2015-08-26 ENCOUNTER — Encounter: Payer: Self-pay | Admitting: Obstetrics and Gynecology

## 2015-09-11 ENCOUNTER — Encounter: Payer: Self-pay | Admitting: Obstetrics and Gynecology

## 2015-09-11 ENCOUNTER — Ambulatory Visit (INDEPENDENT_AMBULATORY_CARE_PROVIDER_SITE_OTHER): Payer: BLUE CROSS/BLUE SHIELD | Admitting: Obstetrics and Gynecology

## 2015-09-11 VITALS — BP 132/67 | HR 77 | Wt 187.7 lb

## 2015-09-11 DIAGNOSIS — Z331 Pregnant state, incidental: Secondary | ICD-10-CM

## 2015-09-11 LAB — POCT URINALYSIS DIPSTICK
Bilirubin, UA: NEGATIVE
Glucose, UA: NEGATIVE
KETONES UA: NEGATIVE
Leukocytes, UA: NEGATIVE
Nitrite, UA: NEGATIVE
PROTEIN UA: NEGATIVE
RBC UA: NEGATIVE
SPEC GRAV UA: 1.015
UROBILINOGEN UA: 0.2
pH, UA: 6

## 2015-09-11 NOTE — Progress Notes (Signed)
ROB- doing well, reviewed negative genetic screening. Anatomy scan next visit

## 2015-09-11 NOTE — Progress Notes (Signed)
ROB-pt denies any complaints 

## 2015-10-02 ENCOUNTER — Ambulatory Visit (INDEPENDENT_AMBULATORY_CARE_PROVIDER_SITE_OTHER): Payer: BLUE CROSS/BLUE SHIELD

## 2015-10-02 DIAGNOSIS — Z331 Pregnant state, incidental: Secondary | ICD-10-CM | POA: Diagnosis not present

## 2015-10-16 ENCOUNTER — Ambulatory Visit (INDEPENDENT_AMBULATORY_CARE_PROVIDER_SITE_OTHER): Payer: BLUE CROSS/BLUE SHIELD | Admitting: Obstetrics and Gynecology

## 2015-10-16 ENCOUNTER — Encounter: Payer: Self-pay | Admitting: Obstetrics and Gynecology

## 2015-10-16 VITALS — BP 133/61 | HR 79 | Wt 191.4 lb

## 2015-10-16 DIAGNOSIS — Z331 Pregnant state, incidental: Secondary | ICD-10-CM

## 2015-10-16 LAB — POCT URINALYSIS DIPSTICK
Bilirubin, UA: NEGATIVE
Glucose, UA: NEGATIVE
Ketones, UA: NEGATIVE
Nitrite, UA: NEGATIVE
Protein, UA: NEGATIVE
Spec Grav, UA: 1.01
Urobilinogen, UA: 0.2
pH, UA: 7

## 2015-10-16 NOTE — Progress Notes (Signed)
ROB- reviewed anatomy scan: Indications: Anatomy Findings:  Singleton intrauterine pregnancy is visualized with FHR at 144 BPM. Biometrics give an (U/S) Gestational age of [redacted] weeks 1 day, and an (U/S) EDD of 02/18/16; this correlates with the clinically established EDD of 02/18/16.  Fetal presentation is breech, spine posterior.  EFW: 322 grams ( 0 lbs. 11 oz. ). Placenta: Anterior, grade 1 and remote to cervix.  AFI: Adequate with a MVP of 3.6 cm.  Anatomic survey is complete and appears WNL. Gender - Female.

## 2015-10-16 NOTE — Progress Notes (Signed)
ROB- pt denies any complaints

## 2015-11-08 ENCOUNTER — Ambulatory Visit (INDEPENDENT_AMBULATORY_CARE_PROVIDER_SITE_OTHER): Payer: BLUE CROSS/BLUE SHIELD | Admitting: Obstetrics and Gynecology

## 2015-11-08 ENCOUNTER — Encounter: Payer: Self-pay | Admitting: Obstetrics and Gynecology

## 2015-11-08 VITALS — BP 125/65 | HR 67 | Wt 192.7 lb

## 2015-11-08 DIAGNOSIS — Z3492 Encounter for supervision of normal pregnancy, unspecified, second trimester: Secondary | ICD-10-CM

## 2015-11-08 LAB — POCT URINALYSIS DIPSTICK
Bilirubin, UA: NEGATIVE
KETONES UA: NEGATIVE
Nitrite, UA: NEGATIVE
SPEC GRAV UA: 1.01
UROBILINOGEN UA: 0.2
pH, UA: 6.5

## 2015-11-08 NOTE — Progress Notes (Signed)
ROB- doing well, recommend water at room temp and occasional TUMS if it persist. Glucola next visit

## 2015-11-08 NOTE — Progress Notes (Signed)
ROB- pt is getting heart burn especially when she is drinking water

## 2015-11-26 ENCOUNTER — Encounter: Payer: BLUE CROSS/BLUE SHIELD | Admitting: Obstetrics and Gynecology

## 2015-12-02 ENCOUNTER — Other Ambulatory Visit: Payer: Self-pay | Admitting: *Deleted

## 2015-12-02 DIAGNOSIS — Z3493 Encounter for supervision of normal pregnancy, unspecified, third trimester: Secondary | ICD-10-CM

## 2015-12-02 DIAGNOSIS — Z131 Encounter for screening for diabetes mellitus: Secondary | ICD-10-CM

## 2015-12-04 ENCOUNTER — Encounter: Payer: Self-pay | Admitting: Obstetrics and Gynecology

## 2015-12-04 ENCOUNTER — Ambulatory Visit (INDEPENDENT_AMBULATORY_CARE_PROVIDER_SITE_OTHER): Payer: BLUE CROSS/BLUE SHIELD | Admitting: Obstetrics and Gynecology

## 2015-12-04 VITALS — BP 109/72 | HR 79 | Wt 191.3 lb

## 2015-12-04 DIAGNOSIS — Z23 Encounter for immunization: Secondary | ICD-10-CM | POA: Diagnosis not present

## 2015-12-04 DIAGNOSIS — Z3493 Encounter for supervision of normal pregnancy, unspecified, third trimester: Secondary | ICD-10-CM

## 2015-12-04 LAB — POCT URINALYSIS DIPSTICK
BILIRUBIN UA: NEGATIVE
Blood, UA: NEGATIVE
Glucose, UA: NEGATIVE
KETONES UA: NEGATIVE
LEUKOCYTES UA: NEGATIVE
Nitrite, UA: NEGATIVE
PH UA: 6.5
Protein, UA: NEGATIVE
SPEC GRAV UA: 1.01
Urobilinogen, UA: 0.2

## 2015-12-04 MED ORDER — TETANUS-DIPHTH-ACELL PERTUSSIS 5-2.5-18.5 LF-MCG/0.5 IM SUSP
0.5000 mL | Freq: Once | INTRAMUSCULAR | Status: AC
  Administered 2015-12-04: 0.5 mL via INTRAMUSCULAR

## 2015-12-04 NOTE — Progress Notes (Signed)
ROB- blood consent signed, glucola done, tdap given Pt is having pressure at the end of the day lower abdomen

## 2015-12-04 NOTE — Patient Instructions (Signed)

## 2015-12-04 NOTE — Progress Notes (Signed)
ROB-doing well,glucola done

## 2015-12-05 LAB — GLUCOSE, 1 HOUR GESTATIONAL: Gestational Diabetes Screen: 130 mg/dL (ref 65–139)

## 2015-12-05 LAB — HEMOGLOBIN AND HEMATOCRIT, BLOOD
Hematocrit: 32.1 % — ABNORMAL LOW (ref 34.0–46.6)
Hemoglobin: 10.7 g/dL — ABNORMAL LOW (ref 11.1–15.9)

## 2015-12-18 LAB — URINE CULTURE: ORGANISM ID, BACTERIA: NO GROWTH

## 2015-12-25 ENCOUNTER — Ambulatory Visit (INDEPENDENT_AMBULATORY_CARE_PROVIDER_SITE_OTHER): Payer: BLUE CROSS/BLUE SHIELD | Admitting: Obstetrics and Gynecology

## 2015-12-25 ENCOUNTER — Encounter: Payer: Self-pay | Admitting: Obstetrics and Gynecology

## 2015-12-25 VITALS — BP 125/63 | HR 68 | Wt 192.8 lb

## 2015-12-25 DIAGNOSIS — Z3493 Encounter for supervision of normal pregnancy, unspecified, third trimester: Secondary | ICD-10-CM

## 2015-12-25 LAB — POCT URINALYSIS DIPSTICK
BILIRUBIN UA: NEGATIVE
Blood, UA: NEGATIVE
GLUCOSE UA: NEGATIVE
Ketones, UA: NEGATIVE
NITRITE UA: NEGATIVE
Spec Grav, UA: 1.015
UROBILINOGEN UA: 0.2
pH, UA: 6.5

## 2015-12-25 NOTE — Progress Notes (Signed)
ROB-samples Ferralet to try- OTC made her constipated. Discussed BC- had IUD in past- didn't like it, desires pills

## 2015-12-25 NOTE — Progress Notes (Signed)
ROB- pt is doing well 

## 2016-01-08 ENCOUNTER — Ambulatory Visit (INDEPENDENT_AMBULATORY_CARE_PROVIDER_SITE_OTHER): Payer: BLUE CROSS/BLUE SHIELD | Admitting: Obstetrics and Gynecology

## 2016-01-08 VITALS — BP 121/63 | HR 86 | Wt 193.9 lb

## 2016-01-08 DIAGNOSIS — Z3493 Encounter for supervision of normal pregnancy, unspecified, third trimester: Secondary | ICD-10-CM

## 2016-01-08 DIAGNOSIS — I863 Vulval varices: Secondary | ICD-10-CM

## 2016-01-08 LAB — POCT URINALYSIS DIPSTICK
BILIRUBIN UA: NEGATIVE
Blood, UA: NEGATIVE
KETONES UA: 5
Nitrite, UA: NEGATIVE
PH UA: 7
Spec Grav, UA: 1.01
Urobilinogen, UA: 0.2

## 2016-01-08 NOTE — Progress Notes (Signed)
ROB- doing well except- bulging vessel that throbs in the evening x 1 week- varicose vein noted on left vulva- witch hazel cool packs explained.

## 2016-01-08 NOTE — Progress Notes (Signed)
ROB- pt is c/o "possible varicose vein L side of labia", pt is having some pressure

## 2016-01-21 ENCOUNTER — Ambulatory Visit (INDEPENDENT_AMBULATORY_CARE_PROVIDER_SITE_OTHER): Payer: BLUE CROSS/BLUE SHIELD | Admitting: Obstetrics and Gynecology

## 2016-01-21 VITALS — BP 118/58 | HR 83 | Wt 197.1 lb

## 2016-01-21 DIAGNOSIS — Z36 Encounter for antenatal screening of mother: Secondary | ICD-10-CM

## 2016-01-21 DIAGNOSIS — Z3685 Encounter for antenatal screening for Streptococcus B: Secondary | ICD-10-CM

## 2016-01-21 DIAGNOSIS — Z113 Encounter for screening for infections with a predominantly sexual mode of transmission: Secondary | ICD-10-CM

## 2016-01-21 DIAGNOSIS — Z3493 Encounter for supervision of normal pregnancy, unspecified, third trimester: Secondary | ICD-10-CM

## 2016-01-21 LAB — POCT URINALYSIS DIPSTICK
Bilirubin, UA: NEGATIVE
Blood, UA: NEGATIVE
GLUCOSE UA: NEGATIVE
Ketones, UA: 40
NITRITE UA: NEGATIVE
SPEC GRAV UA: 1.01
UROBILINOGEN UA: 0.2
pH, UA: 6

## 2016-01-21 NOTE — Progress Notes (Signed)
ROB- doing well, labor precautions discussed, cultures obtianed

## 2016-01-21 NOTE — Progress Notes (Signed)
ROB- cultures obtained, pt is having some pelvic pressure 

## 2016-01-22 LAB — URINE CULTURE: Organism ID, Bacteria: NO GROWTH

## 2016-01-23 LAB — GC/CHLAMYDIA PROBE AMP
CHLAMYDIA, DNA PROBE: NEGATIVE
Neisseria gonorrhoeae by PCR: NEGATIVE

## 2016-01-23 LAB — STREP GP B NAA: STREP GROUP B AG: NEGATIVE

## 2016-01-27 ENCOUNTER — Ambulatory Visit (INDEPENDENT_AMBULATORY_CARE_PROVIDER_SITE_OTHER): Payer: BLUE CROSS/BLUE SHIELD | Admitting: Obstetrics and Gynecology

## 2016-01-27 VITALS — BP 113/64 | HR 70 | Wt 198.1 lb

## 2016-01-27 DIAGNOSIS — Z3493 Encounter for supervision of normal pregnancy, unspecified, third trimester: Secondary | ICD-10-CM

## 2016-01-27 LAB — POCT URINALYSIS DIPSTICK
BILIRUBIN UA: NEGATIVE
GLUCOSE UA: NEGATIVE
Ketones, UA: NEGATIVE
NITRITE UA: NEGATIVE
Protein, UA: NEGATIVE
RBC UA: NEGATIVE
Spec Grav, UA: 1.015
Urobilinogen, UA: 0.2
pH, UA: 6.5

## 2016-01-27 NOTE — Progress Notes (Signed)
ROB-Discussed negative cultures. Labor precautions reiterated with advanced cervical dilation

## 2016-01-27 NOTE — Progress Notes (Signed)
ROB- pt denies any complaints, she is feeling good

## 2016-02-06 ENCOUNTER — Ambulatory Visit (INDEPENDENT_AMBULATORY_CARE_PROVIDER_SITE_OTHER): Payer: BLUE CROSS/BLUE SHIELD | Admitting: Obstetrics and Gynecology

## 2016-02-06 VITALS — BP 114/78 | HR 77 | Wt 198.6 lb

## 2016-02-06 DIAGNOSIS — Z3493 Encounter for supervision of normal pregnancy, unspecified, third trimester: Secondary | ICD-10-CM

## 2016-02-06 LAB — POCT URINALYSIS DIPSTICK
BILIRUBIN UA: NEGATIVE
Blood, UA: NEGATIVE
GLUCOSE UA: NEGATIVE
KETONES UA: NEGATIVE
Nitrite, UA: NEGATIVE
Protein, UA: NEGATIVE
Urobilinogen, UA: 0.2
pH, UA: 6.5

## 2016-02-06 NOTE — Progress Notes (Signed)
ROB- doing well, denies any regular contractions- set for IOL 9/4 if not delivered by then.

## 2016-02-06 NOTE — Progress Notes (Signed)
ROB- pt is having pelvic pressure 

## 2016-02-08 LAB — URINE CULTURE: ORGANISM ID, BACTERIA: NO GROWTH

## 2016-02-10 ENCOUNTER — Encounter: Payer: Self-pay | Admitting: *Deleted

## 2016-02-10 ENCOUNTER — Inpatient Hospital Stay
Admission: EM | Admit: 2016-02-10 | Discharge: 2016-02-11 | DRG: 775 | Disposition: A | Discharge status: Home / Self Care | Payer: BLUE CROSS/BLUE SHIELD | Attending: Obstetrics and Gynecology | Admitting: Obstetrics and Gynecology

## 2016-02-10 ENCOUNTER — Inpatient Hospital Stay: Payer: BLUE CROSS/BLUE SHIELD | Admitting: Anesthesiology

## 2016-02-10 DIAGNOSIS — Z833 Family history of diabetes mellitus: Secondary | ICD-10-CM | POA: Diagnosis not present

## 2016-02-10 DIAGNOSIS — Z3483 Encounter for supervision of other normal pregnancy, third trimester: Secondary | ICD-10-CM

## 2016-02-10 DIAGNOSIS — Z803 Family history of malignant neoplasm of breast: Secondary | ICD-10-CM

## 2016-02-10 DIAGNOSIS — O9989 Other specified diseases and conditions complicating pregnancy, childbirth and the puerperium: Secondary | ICD-10-CM

## 2016-02-10 DIAGNOSIS — Z79899 Other long term (current) drug therapy: Secondary | ICD-10-CM

## 2016-02-10 DIAGNOSIS — Z23 Encounter for immunization: Secondary | ICD-10-CM | POA: Diagnosis not present

## 2016-02-10 DIAGNOSIS — Z3A38 38 weeks gestation of pregnancy: Secondary | ICD-10-CM

## 2016-02-10 DIAGNOSIS — O09899 Supervision of other high risk pregnancies, unspecified trimester: Secondary | ICD-10-CM

## 2016-02-10 DIAGNOSIS — Z283 Underimmunization status: Secondary | ICD-10-CM

## 2016-02-10 DIAGNOSIS — Z8249 Family history of ischemic heart disease and other diseases of the circulatory system: Secondary | ICD-10-CM | POA: Diagnosis not present

## 2016-02-10 DIAGNOSIS — Z3490 Encounter for supervision of normal pregnancy, unspecified, unspecified trimester: Secondary | ICD-10-CM

## 2016-02-10 DIAGNOSIS — O2243 Hemorrhoids in pregnancy, third trimester: Principal | ICD-10-CM | POA: Diagnosis present

## 2016-02-10 DIAGNOSIS — Z2839 Other underimmunization status: Secondary | ICD-10-CM

## 2016-02-10 LAB — CBC
HCT: 34.5 % — ABNORMAL LOW (ref 35.0–47.0)
HEMOGLOBIN: 12.1 g/dL (ref 12.0–16.0)
MCH: 32.6 pg (ref 26.0–34.0)
MCHC: 35.1 g/dL (ref 32.0–36.0)
MCV: 92.8 fL (ref 80.0–100.0)
Platelets: 208 10*3/uL (ref 150–440)
RBC: 3.72 MIL/uL — AB (ref 3.80–5.20)
RDW: 14.3 % (ref 11.5–14.5)
WBC: 9.7 10*3/uL (ref 3.6–11.0)

## 2016-02-10 LAB — TYPE AND SCREEN
ABO/RH(D): O POS
ANTIBODY SCREEN: NEGATIVE

## 2016-02-10 MED ORDER — KETOROLAC TROMETHAMINE 30 MG/ML IJ SOLN: 30.0000 mg | Freq: Four times a day (QID) | INTRAMUSCULAR | Status: DC | PRN

## 2016-02-10 MED ORDER — SENNOSIDES-DOCUSATE SODIUM 8.6-50 MG PO TABS: 2.0000 | ORAL_TABLET | ORAL | Status: DC

## 2016-02-10 MED ORDER — DIBUCAINE 1 % RE OINT: 1.0000 "application " | TOPICAL_OINTMENT | RECTAL | Status: DC | PRN

## 2016-02-10 MED ORDER — COCONUT OIL OIL: 1.0000 "application " | TOPICAL_OIL | Status: DC | PRN

## 2016-02-10 MED ORDER — OXYTOCIN 40 UNITS IN LACTATED RINGERS INFUSION - SIMPLE MED: 2.5000 [IU]/h | INTRAVENOUS | Status: DC

## 2016-02-10 MED ORDER — WITCH HAZEL-GLYCERIN EX PADS
1.0000 "application " | MEDICATED_PAD | CUTANEOUS | Status: DC | PRN
  Administered 2016-02-11: 1 via TOPICAL
  Filled 2016-02-10: qty 100

## 2016-02-10 MED ORDER — BUPIVACAINE HCL (PF) 0.25 % IJ SOLN
INTRAMUSCULAR | Status: DC | PRN
  Administered 2016-02-10 (×2): 5 mL via EPIDURAL

## 2016-02-10 MED ORDER — PRENATAL MULTIVITAMIN CH
1.0000 | ORAL_TABLET | Freq: Every day | ORAL | Status: DC
  Administered 2016-02-11: 1 via ORAL
  Filled 2016-02-10: qty 1

## 2016-02-10 MED ORDER — MEPERIDINE HCL 25 MG/ML IJ SOLN: 6.2500 mg | INTRAMUSCULAR | Status: DC | PRN

## 2016-02-10 MED ORDER — OXYCODONE-ACETAMINOPHEN 5-325 MG PO TABS: 1.0000 | ORAL_TABLET | ORAL | Status: DC | PRN

## 2016-02-10 MED ORDER — MEASLES, MUMPS & RUBELLA VAC ~~LOC~~ INJ
0.5000 mL | INJECTION | Freq: Once | SUBCUTANEOUS | Status: AC
  Administered 2016-02-11: 0.5 mL via SUBCUTANEOUS
  Filled 2016-02-10 (×2): qty 0.5

## 2016-02-10 MED ORDER — DOCUSATE SODIUM 100 MG PO CAPS
100.0000 mg | ORAL_CAPSULE | Freq: Two times a day (BID) | ORAL | Status: DC
  Administered 2016-02-10 – 2016-02-11 (×2): 100 mg via ORAL
  Filled 2016-02-10 (×2): qty 1

## 2016-02-10 MED ORDER — TERBUTALINE SULFATE 1 MG/ML IJ SOLN: 0.2500 mg | Freq: Once | INTRAMUSCULAR | Status: DC | PRN

## 2016-02-10 MED ORDER — ONDANSETRON HCL 4 MG PO TABS: 4.0000 mg | ORAL_TABLET | ORAL | Status: DC | PRN

## 2016-02-10 MED ORDER — LIDOCAINE HCL (PF) 1 % IJ SOLN
INTRAMUSCULAR | Status: AC
  Filled 2016-02-10: qty 30

## 2016-02-10 MED ORDER — DIPHENHYDRAMINE HCL 25 MG PO CAPS: 25.0000 mg | ORAL_CAPSULE | ORAL | Status: DC | PRN

## 2016-02-10 MED ORDER — WITCH HAZEL-GLYCERIN EX PADS
MEDICATED_PAD | CUTANEOUS | Status: AC
  Administered 2016-02-10: 16:00:00
  Filled 2016-02-10: qty 100

## 2016-02-10 MED ORDER — ACETAMINOPHEN 325 MG PO TABS: 650.0000 mg | ORAL_TABLET | ORAL | Status: DC | PRN

## 2016-02-10 MED ORDER — OXYCODONE-ACETAMINOPHEN 5-325 MG PO TABS: 2.0000 | ORAL_TABLET | ORAL | Status: DC | PRN

## 2016-02-10 MED ORDER — LIDOCAINE-EPINEPHRINE (PF) 1.5 %-1:200000 IJ SOLN
INTRAMUSCULAR | Status: DC | PRN
  Administered 2016-02-10: 3 mL via EPIDURAL

## 2016-02-10 MED ORDER — OXYTOCIN BOLUS FROM INFUSION
500.0000 mL | Freq: Once | INTRAVENOUS | Status: AC
  Administered 2016-02-10: 500 mL via INTRAVENOUS

## 2016-02-10 MED ORDER — ONDANSETRON HCL 4 MG/2ML IJ SOLN: 4.0000 mg | INTRAMUSCULAR | Status: DC | PRN

## 2016-02-10 MED ORDER — AMMONIA AROMATIC IN INHA
RESPIRATORY_TRACT | Status: AC
  Filled 2016-02-10: qty 10

## 2016-02-10 MED ORDER — LACTATED RINGERS IV SOLN
INTRAVENOUS | Status: DC
  Administered 2016-02-10: 10:00:00 via INTRAVENOUS

## 2016-02-10 MED ORDER — IBUPROFEN 600 MG PO TABS
600.0000 mg | ORAL_TABLET | Freq: Four times a day (QID) | ORAL | Status: DC
  Administered 2016-02-10 – 2016-02-11 (×3): 600 mg via ORAL
  Filled 2016-02-10 (×3): qty 1

## 2016-02-10 MED ORDER — DIPHENHYDRAMINE HCL 50 MG/ML IJ SOLN: 12.5000 mg | INTRAMUSCULAR | Status: DC | PRN

## 2016-02-10 MED ORDER — BENZOCAINE-MENTHOL 20-0.5 % EX AERO
1.0000 "application " | INHALATION_SPRAY | CUTANEOUS | Status: DC | PRN
  Administered 2016-02-10 – 2016-02-11 (×2): 1 via TOPICAL
  Filled 2016-02-10 (×2): qty 56

## 2016-02-10 MED ORDER — FENTANYL CITRATE (PF) 100 MCG/2ML IJ SOLN: 50.0000 ug | INTRAMUSCULAR | Status: DC | PRN

## 2016-02-10 MED ORDER — SOD CITRATE-CITRIC ACID 500-334 MG/5ML PO SOLN: 30.0000 mL | ORAL | Status: DC | PRN

## 2016-02-10 MED ORDER — ONDANSETRON HCL 4 MG/2ML IJ SOLN: 4.0000 mg | Freq: Three times a day (TID) | INTRAMUSCULAR | Status: DC | PRN

## 2016-02-10 MED ORDER — FENTANYL 2.5 MCG/ML W/ROPIVACAINE 0.2% IN NS 100 ML EPIDURAL INFUSION (ARMC-ANES)
EPIDURAL | Status: AC
  Filled 2016-02-10: qty 100

## 2016-02-10 MED ORDER — OXYTOCIN 40 UNITS IN LACTATED RINGERS INFUSION - SIMPLE MED
1.0000 m[IU]/min | INTRAVENOUS | Status: DC
  Administered 2016-02-10: 1 m[IU]/min via INTRAVENOUS
  Filled 2016-02-10: qty 1000

## 2016-02-10 MED ORDER — LACTATED RINGERS IV SOLN: 500.0000 mL | INTRAVENOUS | Status: DC | PRN

## 2016-02-10 MED ORDER — MISOPROSTOL 200 MCG PO TABS
ORAL_TABLET | ORAL | Status: AC
  Filled 2016-02-10: qty 4

## 2016-02-10 MED ORDER — NALOXONE HCL 2 MG/2ML IJ SOSY
1.0000 ug/kg/h | PREFILLED_SYRINGE | INTRAVENOUS | Status: DC | PRN
  Filled 2016-02-10: qty 2

## 2016-02-10 MED ORDER — LIDOCAINE HCL (PF) 1 % IJ SOLN
INTRAMUSCULAR | Status: DC | PRN
  Administered 2016-02-10: 3 mL

## 2016-02-10 MED ORDER — SIMETHICONE 80 MG PO CHEW: 80.0000 mg | CHEWABLE_TABLET | ORAL | Status: DC | PRN

## 2016-02-10 MED ORDER — OXYCODONE HCL 5 MG PO TABS
5.0000 mg | ORAL_TABLET | ORAL | Status: DC | PRN
Start: 2016-02-10 — End: 2016-02-11
  Administered 2016-02-11: 5 mg via ORAL
  Filled 2016-02-10: qty 1

## 2016-02-10 MED ORDER — NALBUPHINE HCL 10 MG/ML IJ SOLN: 5.0000 mg | Freq: Once | INTRAMUSCULAR | Status: DC | PRN

## 2016-02-10 MED ORDER — SODIUM CHLORIDE 0.9% FLUSH: 3.0000 mL | INTRAVENOUS | Status: DC | PRN

## 2016-02-10 MED ORDER — NALOXONE HCL 0.4 MG/ML IJ SOLN: 0.4000 mg | INTRAMUSCULAR | Status: DC | PRN

## 2016-02-10 MED ORDER — NALBUPHINE HCL 10 MG/ML IJ SOLN: 5.0000 mg | INTRAMUSCULAR | Status: DC | PRN

## 2016-02-10 MED ORDER — FENTANYL 2.5 MCG/ML W/ROPIVACAINE 0.2% IN NS 100 ML EPIDURAL INFUSION (ARMC-ANES)
10.0000 mL/h | EPIDURAL | Status: DC
  Administered 2016-02-10: 10 mL/h via EPIDURAL

## 2016-02-10 MED ORDER — OXYCODONE HCL 5 MG PO TABS
10.0000 mg | ORAL_TABLET | ORAL | Status: DC | PRN
Start: 2016-02-10 — End: 2016-02-11

## 2016-02-10 MED ORDER — ONDANSETRON HCL 4 MG/2ML IJ SOLN: 4.0000 mg | Freq: Four times a day (QID) | INTRAMUSCULAR | Status: DC | PRN

## 2016-02-10 MED ORDER — DIPHENHYDRAMINE HCL 25 MG PO CAPS: 25.0000 mg | ORAL_CAPSULE | Freq: Four times a day (QID) | ORAL | Status: DC | PRN

## 2016-02-10 MED ORDER — VARICELLA VIRUS VACCINE LIVE 1350 PFU/0.5ML IJ SUSR
0.5000 mL | Freq: Once | INTRAMUSCULAR | Status: AC
  Administered 2016-02-11: 0.5 mL via SUBCUTANEOUS
  Filled 2016-02-10 (×2): qty 0.5

## 2016-02-10 MED ORDER — LIDOCAINE HCL (PF) 1 % IJ SOLN: 30.0000 mL | INTRAMUSCULAR | Status: DC | PRN

## 2016-02-10 NOTE — Anesthesia Procedure Notes (Signed)
Epidural Patient location during procedure: OB Start time: 02/10/2016 12:08 PM End time: 02/10/2016 12:20 PM  Staffing Anesthesiologist: Martha Clan Performed: anesthesiologist   Preanesthetic Checklist Completed: patient identified, site marked, surgical consent, pre-op evaluation, timeout performed, IV checked, risks and benefits discussed and monitors and equipment checked  Epidural Patient position: sitting Prep: ChloraPrep Patient monitoring: heart rate, continuous pulse ox and blood pressure Approach: midline Location: L4-L5 Injection technique: LOR saline  Needle:  Needle type: Tuohy  Needle gauge: 17 G Needle length: 9 cm and 9 Needle insertion depth: 6 cm Catheter type: closed end flexible Catheter size: 19 Gauge Catheter at skin depth: 11 cm Test dose: negative and 1.5% lidocaine with Epi 1:200 K  Assessment Sensory level: T10 Events: blood not aspirated, injection not painful, no injection resistance, negative IV test and no paresthesia  Additional Notes Pt. Evaluated and documentation done after procedure finished. Patient identified. Risks/Benefits/Options discussed with patient including but not limited to bleeding, infection, nerve damage, paralysis, failed block, incomplete pain control, headache, blood pressure changes, nausea, vomiting, reactions to medication both or allergic, itching and postpartum back pain. Confirmed with bedside nurse the patient's most recent platelet count. Confirmed with patient that they are not currently taking any anticoagulation, have any bleeding history or any family history of bleeding disorders. Patient expressed understanding and wished to proceed. All questions were answered. Sterile technique was used throughout the entire procedure. Please see nursing notes for vital signs. Test dose was given through epidural catheter and negative prior to continuing to dose epidural or start infusion. Warning signs of high block given to the  patient including shortness of breath, tingling/numbness in hands, complete motor block, or any concerning symptoms with instructions to call for help. Patient was given instructions on fall risk and not to get out of bed. All questions and concerns addressed with instructions to call with any issues or inadequate analgesia.   Patient tolerated the insertion well without immediate complications.Reason for block:procedure for pain

## 2016-02-10 NOTE — Anesthesia Preprocedure Evaluation (Signed)
Anesthesia Evaluation  Patient identified by MRN, date of birth, ID band Patient awake    Reviewed: Allergy & Precautions, H&P , NPO status , Patient's Chart, lab work & pertinent test results, reviewed documented beta blocker date and time   History of Anesthesia Complications Negative for: history of anesthetic complications  Airway Mallampati: II  TM Distance: >3 FB Neck ROM: full    Dental no notable dental hx.    Pulmonary neg pulmonary ROS,    Pulmonary exam normal breath sounds clear to auscultation       Cardiovascular Exercise Tolerance: Good negative cardio ROS Normal cardiovascular exam Rhythm:regular Rate:Normal     Neuro/Psych negative neurological ROS  negative psych ROS   GI/Hepatic negative GI ROS, Neg liver ROS,   Endo/Other  negative endocrine ROS  Renal/GU negative Renal ROS  negative genitourinary   Musculoskeletal   Abdominal   Peds  Hematology negative hematology ROS (+)   Anesthesia Other Findings Past Medical History: No date: Constipation No date: Numerous moles   Reproductive/Obstetrics (+) Pregnancy                             Anesthesia Physical Anesthesia Plan  ASA: II  Anesthesia Plan: Epidural   Post-op Pain Management:    Induction:   Airway Management Planned:   Additional Equipment:   Intra-op Plan:   Post-operative Plan:   Informed Consent: I have reviewed the patients History and Physical, chart, labs and discussed the procedure including the risks, benefits and alternatives for the proposed anesthesia with the patient or authorized representative who has indicated his/her understanding and acceptance.   Dental Advisory Given  Plan Discussed with: Anesthesiologist, CRNA and Surgeon  Anesthesia Plan Comments:         Anesthesia Quick Evaluation

## 2016-02-10 NOTE — H&P (Signed)
Obstetric History and Physical  Cheryl Meyer is a 30 y.o. G2P1001 with IUP at [redacted]w[redacted]d presenting with irregular contraction for elective IOL due to advanced cervical dilation. Patient states she has been having  irregular, every 3-6 minutes contractions, none vaginal bleeding, intact membranes, with active fetal movement.    Prenatal Course Source of Care: [facility]  Pregnancy complications or risks:none  Prenatal labs and studies: ABO, Rh: --/--/O POS (09/04 1015) Antibody: NEG (09/04 1015) Rubella: <20.0 (02/10 0843) RPR: Non Reactive (02/10 0843)  HBsAg: Negative (02/10 0843)  HIV: Non Reactive (02/10 0843)  [id number] (08/15 1426) 1 hr Glucola  normal Genetic screening normal Anatomy US normal  Past Medical History:  Diagnosis Date  . Constipation   . Numerous moles     Past Surgical History:  Procedure Laterality Date  . none      OB History  Gravida Para Term Preterm AB Living  2 1 1     1   SAB TAB Ectopic Multiple Live Births          1    # Outcome Date GA Lbr Len/2nd Weight Sex Delivery Anes PTL Lv  2 Current           1 Term 2014   7 lb 2.1 oz (3.234 kg) F Vag-Spont   LIV      Social History   Social History  . Marital status: Married    Spouse name: N/A  . Number of children: N/A  . Years of education: N/A   Social History Main Topics  . Smoking status: Never Smoker  . Smokeless tobacco: Never Used  . Alcohol use Yes     Comment: occas- once a week  . Drug use: No  . Sexual activity: Yes    Birth control/ protection: None   Other Topics Concern  . None   Social History Narrative  . None    Family History  Problem Relation Age of Onset  . Breast cancer Mother   . Diabetes Father   . Breast cancer Maternal Aunt   . Heart disease Maternal Grandfather   . Colon cancer Neg Hx   . Ovarian cancer Neg Hx     Prescriptions Prior to Admission  Medication Sig Dispense Refill Last Dose  . cholecalciferol (VITAMIN D) 1000 UNITS tablet Take  1,000 Units by mouth daily.   Taking  . prenatal vitamin w/FE, FA (NATACHEW) 29-1 MG CHEW chewable tablet Chew 1 tablet by mouth daily at 12 noon.   Taking  . Probiotic Product (PROBIOTIC & ACIDOPHILUS EX ST PO) Take by mouth.   Taking    No Known Allergies  Review of Systems: Negative except for what is mentioned in HPI.  Physical Exam: BP (!) 102/58   Pulse 82   Temp 98.1 F (36.7 C) (Oral)   Resp 18   Ht 5\' 8"  (1.727 m)   Wt 198 lb (89.8 kg)   LMP 05/14/2015   SpO2 96%   BMI 30.11 kg/m  GENERAL: Well-developed, well-nourished female in no acute distress.  LUNGS: Clear to auscultation bilaterally.  HEART: Regular rate and rhythm. ABDOMEN: Soft, nontender, nondistended, gravid. EXTREMITIES: Nontender, no edema, 2+ distal pulses. Cervical Exam: Dilation: 7 Effacement (%): 60, 70 Cervical Position: Middle, Posterior Station: -2 Presentation: Vertex Exam by:: jac FHT:  Baseline rate 148 bpm   Variability moderate  Accelerations present   Decelerations none Contractions: Every 3-6 mins on now 87mu/min pitocin   Pertinent Labs/Studies:   Results for  orders placed or performed during the hospital encounter of 02/10/16 (from the past 24 hour(s))  CBC     Status: Abnormal   Collection Time: 02/10/16 10:15 AM  Result Value Ref Range   WBC 9.7 3.6 - 11.0 K/uL   RBC 3.72 (L) 3.80 - 5.20 MIL/uL   Hemoglobin 12.1 12.0 - 16.0 g/dL   HCT 34.5 (L) 35.0 - 47.0 %   MCV 92.8 80.0 - 100.0 fL   MCH 32.6 26.0 - 34.0 pg   MCHC 35.1 32.0 - 36.0 g/dL   RDW 14.3 11.5 - 14.5 %   Platelets 208 150 - 440 K/uL  Type and screen     Status: None   Collection Time: 02/10/16 10:15 AM  Result Value Ref Range   ABO/RH(D) O POS    Antibody Screen NEG    Sample Expiration 02/13/2016     Assessment : Cheryl Meyer is a 30 y.o. G2P1001 at [redacted]w[redacted]d being admitted for labor.  Plan: Labor: Expectant management.  Induction/Augmentation as needed, per protocol FWB: Reassuring fetal heart tracing.   GBS negative Delivery plan: Hopeful for vaginal delivery  Charlott Calvario, CNM [facility]

## 2016-02-10 NOTE — Progress Notes (Signed)
Cheryl Meyer is a 30 y.o. G2P1001 at [redacted]w[redacted]d by LMP admitted for induction of labor due to Elective at term.  Subjective: Denies pain with epidural in place  Objective: BP 125/74   Pulse 81   Temp 98.1 F (36.7 C) (Oral)   Resp 18   Ht 5\' 8"  (1.727 m)   Wt 198 lb (89.8 kg)   LMP 05/14/2015   SpO2 96%   BMI 30.11 kg/m  No intake/output data recorded. No intake/output data recorded.  FHT:  FHR: 144 bpm, variability: moderate,  accelerations:  Present,  decelerations:  Absent UC:   irregular, every 2-3 minutes on 7 mu/min pitocin SVE:   8/80/-2- AROM with copious amounts clear fluid; foley placed Labs: Lab Results  Component Value Date   WBC 9.7 02/10/2016   HGB 12.1 02/10/2016   HCT 34.5 (L) 02/10/2016   MCV 92.8 02/10/2016   PLT 208 02/10/2016    Assessment / Plan: Induction of labor due to term with favorable cervix,  progressing well on pitocin  Labor: Progressing normally Preeclampsia:  labs stable Fetal Wellbeing:  Category I Pain Control:  Epidural I/D:  n/a Anticipated MOD:  NSVD  Ramonda Galyon N Londen Bok, CNM 02/10/2016, 1:11 PM

## 2016-02-11 LAB — CBC
HCT: 30.7 % — ABNORMAL LOW (ref 35.0–47.0)
HEMOGLOBIN: 10.7 g/dL — AB (ref 12.0–16.0)
MCH: 32.6 pg (ref 26.0–34.0)
MCHC: 35 g/dL (ref 32.0–36.0)
MCV: 93.3 fL (ref 80.0–100.0)
PLATELETS: 177 10*3/uL (ref 150–440)
RBC: 3.3 MIL/uL — AB (ref 3.80–5.20)
RDW: 14.3 % (ref 11.5–14.5)
WBC: 15.2 10*3/uL — AB (ref 3.6–11.0)

## 2016-02-11 LAB — RPR: RPR Ser Ql: NONREACTIVE

## 2016-02-11 MED ORDER — IBUPROFEN 600 MG PO TABS
600.0000 mg | ORAL_TABLET | Freq: Four times a day (QID) | ORAL | Status: DC
  Administered 2016-02-11: 600 mg via ORAL
  Filled 2016-02-11: qty 1

## 2016-02-11 MED ORDER — NORETHINDRONE 0.35 MG PO TABS: 1.0000 | ORAL_TABLET | Freq: Every day | ORAL | 11 refills | Status: DC

## 2016-02-11 MED ORDER — DOCUSATE SODIUM 100 MG PO CAPS: 100.0000 mg | ORAL_CAPSULE | Freq: Every day | ORAL | 1 refills | Status: DC

## 2016-02-11 MED ORDER — IBUPROFEN 600 MG PO TABS: 600.0000 mg | ORAL_TABLET | Freq: Four times a day (QID) | ORAL | 0 refills | Status: DC

## 2016-02-11 NOTE — Anesthesia Postprocedure Evaluation (Signed)
Anesthesia Post Note  Patient: Cheryl Meyer  Procedure(s) Performed: * No procedures listed *  Patient location during evaluation: Mother Baby Anesthesia Type: Epidural Level of consciousness: awake and alert, oriented and patient cooperative Pain management: satisfactory to patient Vital Signs Assessment: post-procedure vital signs reviewed and stable Respiratory status: spontaneous breathing and respiratory function stable Cardiovascular status: stable Postop Assessment: no headache, adequate PO intake, no signs of nausea or vomiting, epidural receding and patient able to bend at knees Anesthetic complications: no    Last Vitals:  Vitals:   02/10/16 2301 02/11/16 0306  BP: 108/65 111/61  Pulse: (!) 59 72  Resp: 18 18  Temp: 37.2 C 37.2 C    Last Pain:  Vitals:   02/11/16 0306  TempSrc: Oral  PainSc:                  Virgilio Frees

## 2016-02-11 NOTE — Progress Notes (Signed)
Patient discharged home with infant. Vital signs stable, bleeding within normal limits, uterus firm. Discharge instructions, prescriptions, and follow up appointment given to and reviewed with patient. Patient verbalized understanding, all questions answered. Escorted in wheelchair by auxiliary.   Jonne Ply, RN

## 2016-02-11 NOTE — Discharge Summary (Signed)
Obstetric Discharge Summary Reason for Admission: induction of labor Prenatal Procedures: ultrasound Intrapartum Procedures: spontaneous vaginal delivery and episiotomy midline Postpartum Procedures: Rubella Ig and varicella vaccine Complications-Operative and Postpartum: external hemorrhoids Hemoglobin  Date Value Ref Range Status  02/11/2016 10.7 (L) 12.0 - 16.0 g/dL Final   HGB  Date Value Ref Range Status  06/14/2012 11.3 (L) 12.0 - 16.0 g/dL Final   HCT  Date Value Ref Range Status  02/11/2016 30.7 (L) 35.0 - 47.0 % Final   Hematocrit  Date Value Ref Range Status  12/04/2015 32.1 (L) 34.0 - 46.6 % Final    Physical Exam:  General: alert, cooperative and appears stated age 30: appropriate Uterine Fundus: firm Incision: NA DVT Evaluation: No evidence of DVT seen on physical exam. Negative Homan's sign. Calf/Ankle edema is present.  Discharge Diagnoses: Term Pregnancy-delivered and external hemorrhoids  Discharge Information: Date: 02/11/2016 Activity: pelvic rest Diet: routine Medications: PNV, Ibuprofen, Colace and camilla, TUCKs, PrepH prn Condition: stable Instructions: refer to practice specific booklet Discharge to: home   Newborn Data: Live born female "Lanie" Birth Weight: 8 lb 0.8 oz (3650 g) APGAR: 8, 9  Home with mother.  Melody [employer], CNM 02/11/2016, 1:37 PM

## 2016-02-11 NOTE — Discharge Instructions (Signed)
Vaginal Delivery, Care After Refer to this sheet in the next few weeks. These discharge instructions provide you with information on caring for yourself after delivery. Your health care provider may also give you specific instructions. Your treatment has been planned according to the most current medical practices available, but problems sometimes occur. Call your health care provider if you have any problems or questions after you go home. HOME CARE INSTRUCTIONS  Take over-the-counter or prescription medicines only as directed by your health care provider or pharmacist.  Do not drink alcohol, especially if you are breastfeeding or taking medicine to relieve pain.  Do not chew or smoke tobacco.  Do not use illegal drugs.  Continue to use good perineal care. Good perineal care includes:  Wiping your perineum from front to back.  Keeping your perineum clean.  Do not use tampons or douche for the next 6 weeks.   Showers only, no tub baths for the next 6 weeks.  Wear a well-fitting bra that provides breast support.  Eat healthy foods.  Drink enough fluids to keep your urine clear or pale yellow.  Eat high-fiber foods such as whole grain cereals and breads, brown rice, beans, and fresh fruits and vegetables every day. These foods may help prevent or relieve constipation.  Follow your health care provider's recommendations regarding resumption of activities such as climbing stairs, driving, lifting, exercising, or traveling.  No sexual intercourse for at least the next 6 weeks, and then not until you feel ready.   Try to have someone help you with your household activities and your newborn for at least a few days after you leave the hospital.  Rest as much as possible. Try to rest or take a nap when your newborn is sleeping.  Increase your activities gradually.  Keep all of your scheduled postpartum appointments. It is very important to keep your scheduled follow-up appointments. At  these appointments, your health care provider will be checking to make sure that you are healing physically and emotionally. SEEK MEDICAL CARE IF:   You are passing large clots from your vagina. Save any clots to show your health care provider.  You have a foul smelling discharge from your vagina.  You have trouble urinating.  You are urinating frequently.  You have pain when you urinate.  You have a change in your bowel movements.  You have increasing redness, pain, or swelling near your vaginal incision (episiotomy) or vaginal tear.  You have pus draining from your episiotomy or vaginal tear.  Your episiotomy or vaginal tear is separating.  You have painful, hard, or reddened breasts.  You have a severe headache.  You have blurred vision or see spots.  You feel sad or depressed.  You have thoughts of hurting yourself or your newborn.  You have questions about your care, the care of your newborn, or medicines.  You are dizzy or light-headed.  You have a rash.  You have nausea or vomiting.  You were breastfeeding and have not had a menstrual period within 12 weeks after you stopped breastfeeding.  You are not breastfeeding and have not had a menstrual period by the 12th week after delivery.  You have a fever. SEEK IMMEDIATE MEDICAL CARE IF:   You have persistent pain.  You have chest pain.  You have shortness of breath.  You faint.  You have leg pain.  You have stomach pain.  Your vaginal bleeding saturates two or more sanitary pads in 1 hour.   This  information is not intended to replace advice given to you by your health care provider. Make sure you discuss any questions you have with your health care provider.   Document Released: 05/22/2000 Document Revised: 02/13/2015 Document Reviewed: 01/20/2012 Elsevier Interactive Patient Education [employer].

## 2016-02-13 ENCOUNTER — Encounter: Payer: Self-pay | Admitting: Obstetrics and Gynecology

## 2016-02-14 ENCOUNTER — Encounter: Payer: BLUE CROSS/BLUE SHIELD | Admitting: Obstetrics and Gynecology

## 2016-02-17 ENCOUNTER — Other Ambulatory Visit: Payer: Self-pay | Admitting: *Deleted

## 2016-02-17 MED ORDER — HYDROCORTISONE ACE-PRAMOXINE 2.5-1 % RE CREA: 1.0000 "application " | TOPICAL_CREAM | Freq: Three times a day (TID) | RECTAL | 3 refills | Status: DC

## 2016-02-20 DIAGNOSIS — Z0289 Encounter for other administrative examinations: Secondary | ICD-10-CM

## 2016-03-10 ENCOUNTER — Encounter: Payer: Self-pay | Admitting: Obstetrics and Gynecology

## 2016-03-11 ENCOUNTER — Other Ambulatory Visit: Payer: Self-pay | Admitting: Obstetrics and Gynecology

## 2016-03-11 ENCOUNTER — Encounter: Payer: Self-pay | Admitting: Obstetrics and Gynecology

## 2016-03-11 ENCOUNTER — Ambulatory Visit (INDEPENDENT_AMBULATORY_CARE_PROVIDER_SITE_OTHER): Payer: BLUE CROSS/BLUE SHIELD | Admitting: Obstetrics and Gynecology

## 2016-03-11 VITALS — BP 118/74 | HR 98 | Ht 68.0 in | Wt 174.0 lb

## 2016-03-11 DIAGNOSIS — L0232 Furuncle of buttock: Secondary | ICD-10-CM

## 2016-03-11 MED ORDER — SULFAMETHOXAZOLE-TRIMETHOPRIM 800-160 MG PO TABS: 1.0000 | ORAL_TABLET | Freq: Two times a day (BID) | ORAL | 1 refills | Status: AC

## 2016-03-11 NOTE — Progress Notes (Signed)
Subjective:     Patient ID: Cheryl Meyer, female   DOB: 11/13/1985, 30 y.o.   MRN: [id number]  HPI Reports chaffing immediately after delivery from wearing Always pads, and chaffing went away after she stopped wearing them, then 2 days lated had a boil in right groin/panty line, then two more on butt cheeks, all were sore to touch and drained after a few days, now has a larger one on right buttock near upper crack, spontaneously burst last night and has been draining foul bloody pustulant drainage since.  Review of Systems negative except stated in HPI    Objective:   Physical Exam A&O x4 Well groomed female in mild distress Vitals:   03/11/16 1019  Weight: 174 lb (78.9 kg)  Height: 5\' 8"  (1.727 m)  perineum and groin with scars well healed from previous lesions. Right upper buttock with large red tender abccess (6x5cm) noted with bloody pustulant drainage noted out of center of it. Culture obtained and copious amounts expressed.     Assessment:     Right buttock abccess    Plan:     Bactrim DS 1 po BID x 7 days  will modify if need when skin culture results come in. To change bandage regularly, and apply epsom salt soaks prn with cold compresses.  Melody Hopewell, CNM

## 2016-03-11 NOTE — Progress Notes (Signed)
Subjective:     Patient ID: Cheryl Meyer, female   DOB: Nov 28, 1985, 30 y.o.   MRN: [id number]  HPI After delivery of infant got chaffed in vulva due to Always pads, stopped using them and noticed a boil 2 days later, and ha had multiple ones since then, very painful and draining. Has one on buttuck that has foul drainage.and extremely painful.  Never had these before.  Review of Systems Negative except noted in HPI    Objective:   Physical Exam A&O x4 Well groomed female in no distress currently Blood pressure 118/74, pulse 98, height 5\' 8"  (1.727 m), weight 174 lb (78.9 kg), currently breastfeeding. Old scars from previous boils in groin Large abccess on right buttock 6x5cm with purelant drainage noted culture obtained               Assessment:     Right buttock abccess    Plan:     rx for bactrim DS sent in RTC as needed  Lorelle Gibbs, CNM

## 2016-03-15 LAB — ANAEROBIC AND AEROBIC CULTURE

## 2016-03-17 ENCOUNTER — Encounter: Payer: BLUE CROSS/BLUE SHIELD | Admitting: Obstetrics and Gynecology

## 2016-03-25 ENCOUNTER — Ambulatory Visit: Payer: BLUE CROSS/BLUE SHIELD | Admitting: Obstetrics and Gynecology

## 2016-03-26 ENCOUNTER — Encounter: Payer: Self-pay | Admitting: Obstetrics and Gynecology

## 2016-03-26 ENCOUNTER — Ambulatory Visit (INDEPENDENT_AMBULATORY_CARE_PROVIDER_SITE_OTHER): Payer: BLUE CROSS/BLUE SHIELD | Admitting: Obstetrics and Gynecology

## 2016-03-26 NOTE — Progress Notes (Signed)
   Subjective:     Cheryl Meyer is a 30 y.o. female who presents for a postpartum visit. She is 6 weeks postpartum following a spontaneous vaginal delivery. I have fully reviewed the prenatal and intrapartum course. The delivery was at 63 gestational weeks. Outcome: spontaneous vaginal delivery. Anesthesia: epidural. Postpartum course has been complicated by abscess on buttock-now healed. Baby's course has been uncomplicated. Baby is feeding by breast. Bleeding no bleeding. Bowel function is normal. Bladder function is normal. Patient is not sexually active. Contraception method is abstinence and oral progesterone-only contraceptive. Postpartum depression screening: negative.  The following portions of the patient's history were reviewed and updated as appropriate: allergies, current medications, past family history, past medical history, past social history, past surgical history and problem list.  Review of Systems A comprehensive review of systems was negative.   Objective:    BP 120/75   Pulse 64   Ht 5\' 8"  (1.727 m)   Wt 174 lb 4.8 oz (79.1 kg)   Breastfeeding? Yes   BMI 26.50 kg/m   General:  alert, cooperative and appears stated age   Breasts:  not checked  Lungs: clear to auscultation bilaterally  Heart:  regular rate and rhythm, S1, S2 normal, no murmur, click, rub or gallop  Abdomen: soft, non-tender; bowel sounds normal; no masses,  no organomegaly   Vulva:  normal  Vagina: normal vagina, no discharge, exudate, lesion, or erythema  Cervix:  multiparous appearance  Corpus: normal size, contour, position, consistency, mobility, non-tender  Adnexa:  normal adnexa  Rectal Exam: Not performed.        Assessment:     6 weeks postpartum exam. Pap smear not done at today's visit.   Plan:    1. Contraception: oral progesterone-only contraceptive 2. Lactational amenorrhea 3. Follow up in: 4 months for AE or as needed.

## 2016-03-26 NOTE — Patient Instructions (Signed)
  Place postpartum visit patient instructions here.  

## 2016-03-27 LAB — CBC
Hematocrit: 37.6 % (ref 34.0–46.6)
Hemoglobin: 12.8 g/dL (ref 11.1–15.9)
MCH: 30.6 pg (ref 26.6–33.0)
MCHC: 34 g/dL (ref 31.5–35.7)
MCV: 90 fL (ref 79–97)
Platelets: 263 10*3/uL (ref 150–379)
RBC: 4.18 x10E6/uL (ref 3.77–5.28)
RDW: 13.3 % (ref 12.3–15.4)
WBC: 6.9 10*3/uL (ref 3.4–10.8)

## 2016-03-27 LAB — IRON: IRON: 177 ug/dL — AB (ref 27–159)

## 2016-03-27 LAB — VITAMIN D 25 HYDROXY (VIT D DEFICIENCY, FRACTURES): VIT D 25 HYDROXY: 46.6 ng/mL (ref 30.0–100.0)

## 2016-07-30 ENCOUNTER — Ambulatory Visit (INDEPENDENT_AMBULATORY_CARE_PROVIDER_SITE_OTHER): Payer: BLUE CROSS/BLUE SHIELD | Admitting: Obstetrics and Gynecology

## 2016-07-30 ENCOUNTER — Encounter: Payer: Self-pay | Admitting: Obstetrics and Gynecology

## 2016-07-30 ENCOUNTER — Other Ambulatory Visit: Payer: Self-pay | Admitting: Obstetrics and Gynecology

## 2016-07-30 VITALS — BP 116/71 | HR 64 | Ht 68.0 in | Wt 177.0 lb

## 2016-07-30 DIAGNOSIS — N921 Excessive and frequent menstruation with irregular cycle: Secondary | ICD-10-CM | POA: Diagnosis not present

## 2016-07-30 DIAGNOSIS — Z01419 Encounter for gynecological examination (general) (routine) without abnormal findings: Secondary | ICD-10-CM

## 2016-07-30 NOTE — Progress Notes (Signed)
Subjective:   Cheryl Meyer is a 31 y.o. G44P2002 Caucasian female here for a routine well-woman exam.  Patient's last menstrual period was 07/27/2016.    Current complaints: 2 periods a month for last few months, PCP: me       doesn't desire labs  Social History: Sexual: heterosexual Marital Status: married Living situation: with family Occupation: unknown occupation Tobacco/alcohol: no tobacco use Illicit drugs: no history of illicit drug use  The following portions of the patient's history were reviewed and updated as appropriate: allergies, current medications, past family history, past medical history, past social history, past surgical history and problem list.  Past Medical History Past Medical History:  Diagnosis Date  . Constipation   . Numerous moles     Past Surgical History Past Surgical History:  Procedure Laterality Date  . none      Gynecologic History G2P2002  Patient's last menstrual period was 07/27/2016. Contraception: oral progesterone-only contraceptive Last Pap: 2015. Results were: normal   Obstetric History OB History  Gravida Para Term Preterm AB Living  2 2 2     2   SAB TAB Ectopic Multiple Live Births        0 2    # Outcome Date GA Lbr Len/2nd Weight Sex Delivery Anes PTL Lv  2 Term 02/10/16 [redacted]w[redacted]d  8 lb 0.8 oz (3.65 kg) F Vag-Spont EPI  LIV  1 Term 2014   7 lb 2.1 oz (3.234 kg) F Vag-Spont   LIV      Current Medications Current Outpatient Prescriptions on File Prior to Visit  Medication Sig Dispense Refill  . cholecalciferol (VITAMIN D) 1000 UNITS tablet Take 1,000 Units by mouth daily.    . norethindrone (MICRONOR,CAMILA,ERRIN) 0.35 MG tablet Take 1 tablet (0.35 mg total) by mouth daily. 1 Package 11  . prenatal vitamin w/FE, FA (NATACHEW) 29-1 MG CHEW chewable tablet Chew 1 tablet by mouth daily at 12 noon.    . sulfamethoxazole-trimethoprim (BACTRIM DS,SEPTRA DS) 800-160 MG tablet Take 1 tablet by mouth 2 (two) times daily.  (Patient not taking: Reported on 03/26/2016) 14 tablet 1   No current facility-administered medications on file prior to visit.     Review of Systems Patient denies any headaches, blurred vision, shortness of breath, chest pain, abdominal pain, problems with bowel movements, urination, or intercourse.  Objective:  BP 116/71   Pulse 64   Ht 5\' 8"  (1.727 m)   Wt 177 lb (80.3 kg)   LMP 07/27/2016   Breastfeeding? Yes   BMI 26.91 kg/m  Physical Exam  General:  Well developed, well nourished, no acute distress. She is alert and oriented x3. Skin:  Warm and dry Neck:  Midline trachea, no thyromegaly or nodules Cardiovascular: Regular rate and rhythm, no murmur heard Lungs:  Effort normal, all lung fields clear to auscultation bilaterally Breasts:  No dominant palpable mass, retraction, or nipple discharge Abdomen:  Soft, non tender, no hepatosplenomegaly or masses Pelvic:  External genitalia is normal in appearance.  The vagina is normal in appearance. The cervix is bulbous, no CMT.  Thin prep pap is done with HR HPV cotesting. Uterus is felt to be normal size, shape, and contour.  No adnexal masses or tenderness noted. Extremities:  No swelling or varicosities noted Psych:  She has a normal mood and affect  Assessment:   Healthy well-woman exam BTB on OCPs   Plan:  BTB on camila-ok to increase to 2 pills a day when bleeding is happening. F/U 1  year for AE, or sooner if needed   Jamelle Goldston Rockney Ghee, CNM

## 2016-08-05 LAB — CYTOLOGY - PAP

## 2016-12-29 ENCOUNTER — Other Ambulatory Visit: Payer: Self-pay | Admitting: Obstetrics and Gynecology

## 2017-02-22 ENCOUNTER — Other Ambulatory Visit: Payer: Self-pay | Admitting: *Deleted

## 2017-02-22 ENCOUNTER — Telehealth: Payer: Self-pay | Admitting: Obstetrics and Gynecology

## 2017-02-22 MED ORDER — NORETHIN ACE-ETH ESTRAD-FE 1-20 MG-MCG PO TABS: 1.0000 | ORAL_TABLET | Freq: Every day | ORAL | 4 refills | Status: DC

## 2017-02-22 NOTE — Telephone Encounter (Signed)
Sent to [facility]

## 2017-02-22 NOTE — Telephone Encounter (Signed)
Pt stated that Melody advised pt to call when she has stopped breast feeding. Pt stated that she has stopped and would like birth control sent to [place]. Pt stated she wasn't sure the name of what she was taking before. Please advise. Thanks TNP

## 2017-08-05 ENCOUNTER — Encounter: Payer: BLUE CROSS/BLUE SHIELD | Admitting: Obstetrics and Gynecology

## 2017-08-18 ENCOUNTER — Encounter: Payer: BLUE CROSS/BLUE SHIELD | Admitting: Obstetrics and Gynecology

## 2017-09-28 ENCOUNTER — Encounter: Payer: Self-pay | Admitting: Obstetrics and Gynecology

## 2017-09-28 ENCOUNTER — Ambulatory Visit (INDEPENDENT_AMBULATORY_CARE_PROVIDER_SITE_OTHER): Payer: BLUE CROSS/BLUE SHIELD | Admitting: Obstetrics and Gynecology

## 2017-09-28 ENCOUNTER — Other Ambulatory Visit: Payer: Self-pay | Admitting: Obstetrics and Gynecology

## 2017-09-28 VITALS — BP 143/86 | HR 63 | Ht 67.0 in | Wt 185.0 lb

## 2017-09-28 DIAGNOSIS — Z803 Family history of malignant neoplasm of breast: Secondary | ICD-10-CM | POA: Diagnosis not present

## 2017-09-28 DIAGNOSIS — Z01419 Encounter for gynecological examination (general) (routine) without abnormal findings: Secondary | ICD-10-CM

## 2017-09-28 MED ORDER — NORETHIN ACE-ETH ESTRAD-FE 1-20 MG-MCG PO TABS: 1.0000 | ORAL_TABLET | Freq: Every day | ORAL | 4 refills | Status: DC

## 2017-09-28 NOTE — Progress Notes (Signed)
Subjective:   Cheryl Meyer is a 32 y.o. G85P2002 Caucasian female here for a routine well-woman exam.  Patient's last menstrual period was 09/09/2017.    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  Past Medical History Past Medical History:  Diagnosis Date  . Constipation   . Numerous moles     Past Surgical History Past Surgical History:  Procedure Laterality Date  . none      Gynecologic History G2P2002  Patient's last menstrual period was 09/09/2017. Contraception: OCP (estrogen/progesterone) Last Pap: 2018. Results were: normal   Obstetric History OB History  Gravida Para Term Preterm AB Living  2 2 2     2   SAB TAB Ectopic Multiple Live Births        0 2    # Outcome Date GA Lbr Len/2nd Weight Sex Delivery Anes PTL Lv  2 Term 02/10/16 [redacted]w[redacted]d  8 lb 0.8 oz (3.65 kg) F Vag-Spont EPI  LIV  1 Term 2014   7 lb 2.1 oz (3.234 kg) F Vag-Spont   LIV    Current Medications Current Outpatient Medications on File Prior to Visit  Medication Sig Dispense Refill  . cholecalciferol (VITAMIN D) 1000 UNITS tablet Take 1,000 Units by mouth daily.    . norethindrone-ethinyl estradiol (JUNEL FE 1/20) 1-20 MG-MCG tablet Take 1 tablet by mouth daily. 3 Package 4  . prenatal vitamin w/FE, FA (NATACHEW) 29-1 MG CHEW chewable tablet Chew 1 tablet by mouth daily at 12 noon.    . sulfamethoxazole-trimethoprim (BACTRIM DS,SEPTRA DS) 800-160 MG tablet Take 1 tablet by mouth 2 (two) times daily. (Patient not taking: Reported on 03/26/2016) 14 tablet  1   No current facility-administered medications on file prior to visit.     Review of Systems Patient denies any headaches, blurred vision, shortness of breath, chest pain, abdominal pain, problems with bowel movements, urination, or intercourse.  Objective:  BP (!) 143/86   Pulse 63   Ht 5\' 7"  (1.702 m)   Wt 185 lb (83.9 kg)   LMP 09/09/2017   BMI 28.98 kg/m  Physical Exam  General:  Well developed, well nourished, no acute distress. She is alert and oriented x3. Skin:  Warm and dry Neck:  Midline trachea, no thyromegaly or nodules Cardiovascular: Regular rate and rhythm, no murmur heard Lungs:  Effort normal, all lung fields clear to auscultation bilaterally Breasts:  No dominant palpable mass, retraction, or nipple discharge Abdomen:  Soft, non tender, no hepatosplenomegaly or masses Pelvic:  External genitalia is normal in appearance.  The vagina is normal in appearance. The cervix is bulbous, no CMT.  Thin prep pap is not done . Uterus is felt to be normal size, shape, and contour.  No adnexal masses or tenderness noted. Extremities:  No swelling or varicosities noted Psych:  She has a normal mood and affect  Assessment:   Healthy well-woman exam Family history of breast cancer in mom OCP user  Plan:  meds refilled. F/U 1 year for AE, or sooner if needed   Sable Knoles Rockney Ghee, CNM

## 2017-09-28 NOTE — Patient Instructions (Signed)
Preventive Care 18-39 Years, Female Preventive care refers to lifestyle choices and visits with your health care provider that can promote health and wellness. What does preventive care include?  A yearly physical exam. This is also called an annual well check.  Dental exams once or twice a year.  Routine eye exams. Ask your health care provider how often you should have your eyes checked.  Personal lifestyle choices, including: ? Daily care of your teeth and gums. ? Regular physical activity. ? Eating a healthy diet. ? Avoiding tobacco and drug use. ? Limiting alcohol use. ? Practicing safe sex. ? Taking vitamin and mineral supplements as recommended by your health care provider. What happens during an annual well check? The services and screenings done by your health care provider during your annual well check will depend on your age, overall health, lifestyle risk factors, and family history of disease. Counseling Your health care provider may ask you questions about your:  Alcohol use.  Tobacco use.  Drug use.  Emotional well-being.  Home and relationship well-being.  Sexual activity.  Eating habits.  Work and work Statistician.  Method of birth control.  Menstrual cycle.  Pregnancy history.  Screening You may have the following tests or measurements:  Height, weight, and BMI.  Diabetes screening. This is done by checking your blood sugar (glucose) after you have not eaten for a while (fasting).  Blood pressure.  Lipid and cholesterol levels. These may be checked every 5 years starting at age 38.  Skin check.  Hepatitis C blood test.  Hepatitis B blood test.  Sexually transmitted disease (STD) testing.  BRCA-related cancer screening. This may be done if you have a family history of breast, ovarian, tubal, or peritoneal cancers.  Pelvic exam and Pap test. This may be done every 3 years starting at age 38. Starting at age 30, this may be done  every 5 years if you have a Pap test in combination with an HPV test.  Discuss your test results, treatment options, and if necessary, the need for more tests with your health care provider. Vaccines Your health care provider may recommend certain vaccines, such as:  Influenza vaccine. This is recommended every year.  Tetanus, diphtheria, and acellular pertussis (Tdap, Td) vaccine. You may need a Td booster every 10 years.  Varicella vaccine. You may need this if you have not been vaccinated.  HPV vaccine. If you are 39 or younger, you may need three doses over 6 months.  Measles, mumps, and rubella (MMR) vaccine. You may need at least one dose of MMR. You may also need a second dose.  Pneumococcal 13-valent conjugate (PCV13) vaccine. You may need this if you have certain conditions and were not previously vaccinated.  Pneumococcal polysaccharide (PPSV23) vaccine. You may need one or two doses if you smoke cigarettes or if you have certain conditions.  Meningococcal vaccine. One dose is recommended if you are age 68-21 years and a first-year college student living in a residence hall, or if you have one of several medical conditions. You may also need additional booster doses.  Hepatitis A vaccine. You may need this if you have certain conditions or if you travel or work in places where you may be exposed to hepatitis A.  Hepatitis B vaccine. You may need this if you have certain conditions or if you travel or work in places where you may be exposed to hepatitis B.  Haemophilus influenzae type b (Hib) vaccine. You may need this  if you have certain risk factors.  Talk to your health care provider about which screenings and vaccines you need and how often you need them. This information is not intended to replace advice given to you by your health care provider. Make sure you discuss any questions you have with your health care provider. Document Released: 07/21/2001 Document Revised:  02/12/2016 Document Reviewed: 03/26/2015 Elsevier Interactive Patient Education  2018 Elsevier Inc.  

## 2017-09-29 LAB — CYTOLOGY - PAP

## 2018-09-23 ENCOUNTER — Encounter: Payer: Self-pay | Admitting: *Deleted

## 2018-10-04 ENCOUNTER — Other Ambulatory Visit: Payer: Self-pay | Admitting: *Deleted

## 2018-10-04 MED ORDER — NORETHIN ACE-ETH ESTRAD-FE 1-20 MG-MCG PO TABS: 1.0000 | ORAL_TABLET | Freq: Every day | ORAL | 2 refills | Status: DC

## 2018-10-06 ENCOUNTER — Encounter: Payer: BLUE CROSS/BLUE SHIELD | Admitting: Obstetrics and Gynecology

## 2018-11-25 ENCOUNTER — Encounter: Payer: Self-pay | Admitting: Obstetrics and Gynecology

## 2018-11-29 ENCOUNTER — Telehealth: Payer: Self-pay | Admitting: *Deleted

## 2018-11-29 NOTE — Telephone Encounter (Signed)
Coronavirus (COVID-19) Are you at risk?  Are you at risk for the Coronavirus (COVID-19)?  To be considered HIGH RISK for Coronavirus (COVID-19), you have to meet the following criteria:  . Traveled to [place], [place], [place], [place] or [place]; or in the [place] to [place], [place], [place], or [place]; and have fever, cough, and shortness of breath within the last 2 weeks of travel OR . Been in close contact with a person diagnosed with COVID-19 within the last 2 weeks and have fever, cough, and shortness of breath . IF YOU DO NOT MEET THESE CRITERIA, YOU ARE CONSIDERED LOW RISK FOR COVID-19.  What to do if you are HIGH RISK for COVID-19?  . If you are having a medical emergency, call 911. . Seek medical care right away. Before you go to a doctor's office, urgent care or emergency department, call ahead and tell them about your recent travel, contact with someone diagnosed with COVID-19, and your symptoms. You should receive instructions from your physician's office regarding next steps of care.  . When you arrive at healthcare provider, tell the healthcare staff immediately you have returned from visiting [place], [place], [place], [place] or [place]; or traveled in the [place] to [place], [place], [place], or [place]; in the last two weeks or you have been in close contact with a person diagnosed with COVID-19 in the last 2 weeks.   . Tell the health care staff about your symptoms: fever, cough and shortness of breath. . After you have been seen by a medical provider, you will be either: o Tested for (COVID-19) and discharged home on quarantine except to seek medical care if symptoms worsen, and asked to  - Stay home and avoid contact with others until you get your results (4-5 days)  - Avoid travel on public transportation if possible (such as bus, train, or airplane) or o Sent to the Emergency Department by EMS for evaluation, COVID-19 testing, and possible  admission depending on your condition and test results.  What to do if you are LOW RISK for COVID-19?  Reduce your risk of any infection by using the same precautions used for avoiding the common cold or flu:  . Wash your hands often with soap and warm water for at least 20 seconds.  If soap and water are not readily available, use an alcohol-based hand sanitizer with at least 60% alcohol.  . If coughing or sneezing, cover your mouth and nose by coughing or sneezing into the elbow areas of your shirt or coat, into a tissue or into your sleeve (not your hands). . Avoid shaking hands with others and consider head nods or verbal greetings only. . Avoid touching your eyes, nose, or mouth with unwashed hands.  . Avoid close contact with people who are sick. . Avoid places or events with large numbers of people in one location, like concerts or sporting events. . Carefully consider travel plans you have or are making. . If you are planning any travel outside or inside the [place], visit the CDC's Travelers' Health webpage for the latest health notices. . If you have some symptoms but not all symptoms, continue to monitor at home and seek medical attention if your symptoms worsen. . If you are having a medical emergency, call 911.   ADDITIONAL HEALTHCARE OPTIONS FOR PATIENTS  [facility] / e-Visit: [email or web address]         [facility]: [phone]  [facility]: [phone]                   [facility]: [phone]   Spoke with pt denies any sx.  Kendle Erker, CMA 

## 2018-11-30 ENCOUNTER — Other Ambulatory Visit: Payer: Self-pay

## 2018-11-30 ENCOUNTER — Ambulatory Visit (INDEPENDENT_AMBULATORY_CARE_PROVIDER_SITE_OTHER): Payer: BLUE CROSS/BLUE SHIELD | Admitting: Obstetrics and Gynecology

## 2018-11-30 ENCOUNTER — Encounter: Payer: Self-pay | Admitting: Obstetrics and Gynecology

## 2018-11-30 VITALS — BP 130/84 | HR 73 | Ht 68.0 in | Wt 168.9 lb

## 2018-11-30 DIAGNOSIS — Z01419 Encounter for gynecological examination (general) (routine) without abnormal findings: Secondary | ICD-10-CM

## 2018-11-30 DIAGNOSIS — Z803 Family history of malignant neoplasm of breast: Secondary | ICD-10-CM | POA: Diagnosis not present

## 2018-11-30 NOTE — Patient Instructions (Signed)
 Preventive Care 18-39 Years, Female Preventive care refers to lifestyle choices and visits with your health care provider that can promote health and wellness. What does preventive care include?   A yearly physical exam. This is also called an annual well check.  Dental exams once or twice a year.  Routine eye exams. Ask your health care provider how often you should have your eyes checked.  Personal lifestyle choices, including: ? Daily care of your teeth and gums. ? Regular physical activity. ? Eating a healthy diet. ? Avoiding tobacco and drug use. ? Limiting alcohol use. ? Practicing safe sex. ? Taking vitamin and mineral supplements as recommended by your health care provider. What happens during an annual well check? The services and screenings done by your health care provider during your annual well check will depend on your age, overall health, lifestyle risk factors, and family history of disease. Counseling Your health care provider may ask you questions about your:  Alcohol use.  Tobacco use.  Drug use.  Emotional well-being.  Home and relationship well-being.  Sexual activity.  Eating habits.  Work and work environment.  Method of birth control.  Menstrual cycle.  Pregnancy history. Screening You may have the following tests or measurements:  Height, weight, and BMI.  Diabetes screening. This is done by checking your blood sugar (glucose) after you have not eaten for a while (fasting).  Blood pressure.  Lipid and cholesterol levels. These may be checked every 5 years starting at age 20.  Skin check.  Hepatitis C blood test.  Hepatitis B blood test.  Sexually transmitted disease (STD) testing.  BRCA-related cancer screening. This may be done if you have a family history of breast, ovarian, tubal, or peritoneal cancers.  Pelvic exam and Pap test. This may be done every 3 years starting at age 21. Starting at age 30, this may be done  every 5 years if you have a Pap test in combination with an HPV test. Discuss your test results, treatment options, and if necessary, the need for more tests with your health care provider. Vaccines Your health care provider may recommend certain vaccines, such as:  Influenza vaccine. This is recommended every year.  Tetanus, diphtheria, and acellular pertussis (Tdap, Td) vaccine. You may need a Td booster every 10 years.  Varicella vaccine. You may need this if you have not been vaccinated.  HPV vaccine. If you are 26 or younger, you may need three doses over 6 months.  Measles, mumps, and rubella (MMR) vaccine. You may need at least one dose of MMR. You may also need a second dose.  Pneumococcal 13-valent conjugate (PCV13) vaccine. You may need this if you have certain conditions and were not previously vaccinated.  Pneumococcal polysaccharide (PPSV23) vaccine. You may need one or two doses if you smoke cigarettes or if you have certain conditions.  Meningococcal vaccine. One dose is recommended if you are age 19-21 years and a first-year college student living in a residence hall, or if you have one of several medical conditions. You may also need additional booster doses.  Hepatitis A vaccine. You may need this if you have certain conditions or if you travel or work in places where you may be exposed to hepatitis A.  Hepatitis B vaccine. You may need this if you have certain conditions or if you travel or work in places where you may be exposed to hepatitis B.  Haemophilus influenzae type b (Hib) vaccine. You may need this if   you have certain risk factors. Talk to your health care provider about which screenings and vaccines you need and how often you need them. This information is not intended to replace advice given to you by your health care provider. Make sure you discuss any questions you have with your health care provider. Document Released: 07/21/2001 Document Revised:  01/05/2017 Document Reviewed: 03/26/2015 Elsevier Interactive Patient Education  2019 Elsevier Inc.  

## 2018-11-30 NOTE — Progress Notes (Signed)
Subjective:   Cheryl Meyer is a 33 y.o. G16P2002 Caucasian female here for a routine well-woman exam.  No LMP recorded.    Current complaints: none PCP: none       does desire labs  Social History: Sexual: heterosexual Marital Status: married Living situation: with family Occupation: IT trainer for [employer] Tobacco/alcohol: no tobacco use Illicit drugs: no history of illicit drug use  The following portions of the patient's history were reviewed and updated as appropriate: allergies, current medications, past family history, past medical history, past social history, past surgical history and problem list.  Past Medical History Past Medical History:  Diagnosis Date  . Constipation   . Numerous moles     Past Surgical History Past Surgical History:  Procedure Laterality Date  . none      Gynecologic History G2P2002  No LMP recorded. Contraception: OCP (estrogen/progesterone) Last Pap: 09/2017. Results were: normal   Obstetric History OB History  Gravida Para Term Preterm AB Living  2 2 2     2   SAB TAB Ectopic Multiple Live Births        0 2    # Outcome Date GA Lbr Len/2nd Weight Sex Delivery Anes PTL Lv  2 Term 02/10/16 [redacted]w[redacted]d  8 lb 0.8 oz (3.65 kg) F Vag-Spont EPI  LIV  1 Term 2014   7 lb 2.1 oz (3.234 kg) F Vag-Spont   LIV    Current Medications Current Outpatient Medications on File Prior to Visit  Medication Sig Dispense Refill  . cholecalciferol (VITAMIN D) 1000 UNITS tablet Take 1,000 Units by mouth daily.    . norethindrone-ethinyl estradiol (JUNEL FE 1/20) 1-20 MG-MCG tablet Take 1 tablet by mouth daily. 3 Package 2  . prenatal vitamin w/FE, FA (NATACHEW) 29-1 MG CHEW chewable tablet Chew 1 tablet by mouth daily at 12 noon.    . sulfamethoxazole-trimethoprim (BACTRIM DS,SEPTRA DS) 800-160 MG tablet Take 1 tablet by mouth 2 (two) times daily. (Patient not taking: Reported on 03/26/2016) 14 tablet 1   No current facility-administered  medications on file prior to visit.     Review of Systems Patient denies any headaches, blurred vision, shortness of breath, chest pain, abdominal pain, problems with bowel movements, urination, or intercourse.  Objective:  BP 130/84   Pulse 73   Ht 5\' 8"  (1.727 m)   Wt 168 lb 14.4 oz (76.6 kg)   BMI 25.68 kg/m  Physical Exam  General:  Well developed, well nourished, no acute distress. She is alert and oriented x3. Skin:  Warm and dry Neck:  Midline trachea, no thyromegaly or nodules Cardiovascular: Regular rate and rhythm, no murmur heard Lungs:  Effort normal, all lung fields clear to auscultation bilaterally Breasts:  No dominant palpable mass, retraction, or nipple discharge Abdomen:  Soft, non tender, no hepatosplenomegaly or masses Pelvic:  External genitalia is normal in appearance.  The vagina is normal in appearance. The cervix is bulbous, no CMT.  Thin prep pap is not done . Uterus is felt to be normal size, shape, and contour.  No adnexal masses or tenderness noted. Extremities:  No swelling or varicosities noted Psych:  She has a normal mood and affect  Assessment:   Healthy well-woman exam BMI 25 Family h/o breast cancer  Plan:  Labs obtained- will follow up accordingly. F/U 1 year for AE, or sooner if needed Mammogram ordered   Angalina Ante Rockney Ghee, CNM

## 2018-12-01 LAB — COMPREHENSIVE METABOLIC PANEL
ALT: 14 IU/L (ref 0–32)
AST: 13 IU/L (ref 0–40)
Albumin/Globulin Ratio: 1.8 (ref 1.2–2.2)
Albumin: 4.6 g/dL (ref 3.8–4.8)
Alkaline Phosphatase: 54 IU/L (ref 39–117)
BUN/Creatinine Ratio: 14 (ref 9–23)
BUN: 13 mg/dL (ref 6–20)
Bilirubin Total: 0.3 mg/dL (ref 0.0–1.2)
CO2: 23 mmol/L (ref 20–29)
Calcium: 9.4 mg/dL (ref 8.7–10.2)
Chloride: 102 mmol/L (ref 96–106)
Creatinine, Ser: 0.91 mg/dL (ref 0.57–1.00)
GFR calc Af Amer: 95 mL/min/{1.73_m2} (ref >59)
GFR calc non Af Amer: 82 mL/min/{1.73_m2} (ref >59)
Globulin, Total: 2.6 g/dL (ref 1.5–4.5)
Glucose: 90 mg/dL (ref 65–99)
Potassium: 4.4 mmol/L (ref 3.5–5.2)
Sodium: 141 mmol/L (ref 134–144)
Total Protein: 7.2 g/dL (ref 6.0–8.5)

## 2018-12-01 LAB — HEMOGLOBIN A1C
Est. average glucose Bld gHb Est-mCnc: 100 mg/dL
Hgb A1c MFr Bld: 5.1 % (ref 4.8–5.6)

## 2018-12-01 LAB — LIPID PANEL
Chol/HDL Ratio: 3.1 ratio (ref 0.0–4.4)
Cholesterol, Total: 186 mg/dL (ref 100–199)
HDL: 60 mg/dL (ref >39)
LDL Calculated: 97 mg/dL (ref 0–99)
Triglycerides: 146 mg/dL (ref 0–149)
VLDL Cholesterol Cal: 29 mg/dL (ref 5–40)

## 2018-12-01 LAB — TSH: TSH: 2.67 u[IU]/mL (ref 0.450–4.500)

## 2019-03-28 ENCOUNTER — Other Ambulatory Visit: Payer: Self-pay | Admitting: Obstetrics and Gynecology

## 2020-04-18 ENCOUNTER — Other Ambulatory Visit: Payer: Self-pay | Admitting: Internal Medicine

## 2020-04-18 DIAGNOSIS — Z1231 Encounter for screening mammogram for malignant neoplasm of breast: Secondary | ICD-10-CM

## 2021-04-01 ENCOUNTER — Other Ambulatory Visit: Payer: Self-pay | Admitting: Internal Medicine

## 2021-04-01 DIAGNOSIS — Z1231 Encounter for screening mammogram for malignant neoplasm of breast: Secondary | ICD-10-CM

## 2021-04-17 ENCOUNTER — Ambulatory Visit
Admission: RE | Admit: 2021-04-17 | Discharge: 2021-04-17 | Disposition: A | Discharge status: Home / Self Care | Payer: Managed Care, Other (non HMO) | Source: Ambulatory Visit | Attending: Internal Medicine | Admitting: Internal Medicine

## 2021-04-17 ENCOUNTER — Other Ambulatory Visit: Payer: Self-pay

## 2021-04-17 DIAGNOSIS — Z1231 Encounter for screening mammogram for malignant neoplasm of breast: Secondary | ICD-10-CM | POA: Diagnosis not present

## 2022-05-04 ENCOUNTER — Other Ambulatory Visit: Payer: Self-pay | Admitting: Internal Medicine

## 2022-05-04 DIAGNOSIS — Z1231 Encounter for screening mammogram for malignant neoplasm of breast: Secondary | ICD-10-CM

## 2022-10-13 ENCOUNTER — Ambulatory Visit
Admission: RE | Admit: 2022-10-13 | Discharge: 2022-10-13 | Disposition: A | Discharge status: Home / Self Care | Payer: Managed Care, Other (non HMO) | Source: Ambulatory Visit | Attending: Internal Medicine | Admitting: Internal Medicine

## 2022-10-13 DIAGNOSIS — Z1231 Encounter for screening mammogram for malignant neoplasm of breast: Secondary | ICD-10-CM | POA: Diagnosis not present

## 2023-04-26 IMAGING — MG MM DIGITAL SCREENING BILAT W/ TOMO AND CAD
8 series · 8 of 24 positions shown · non-contrast
Comparison: None.

CLINICAL DATA: Screening. Strong family history of breast cancer.

EXAM:
DIGITAL SCREENING BILATERAL MAMMOGRAM WITH TOMOSYNTHESIS AND CAD
TECHNIQUE: Bilateral screening digital craniocaudal and mediolateral oblique
mammograms were obtained. Bilateral screening digital breast
tomosynthesis was performed. The images were evaluated with
computer-aided detection.

[R MLO synth-2D]
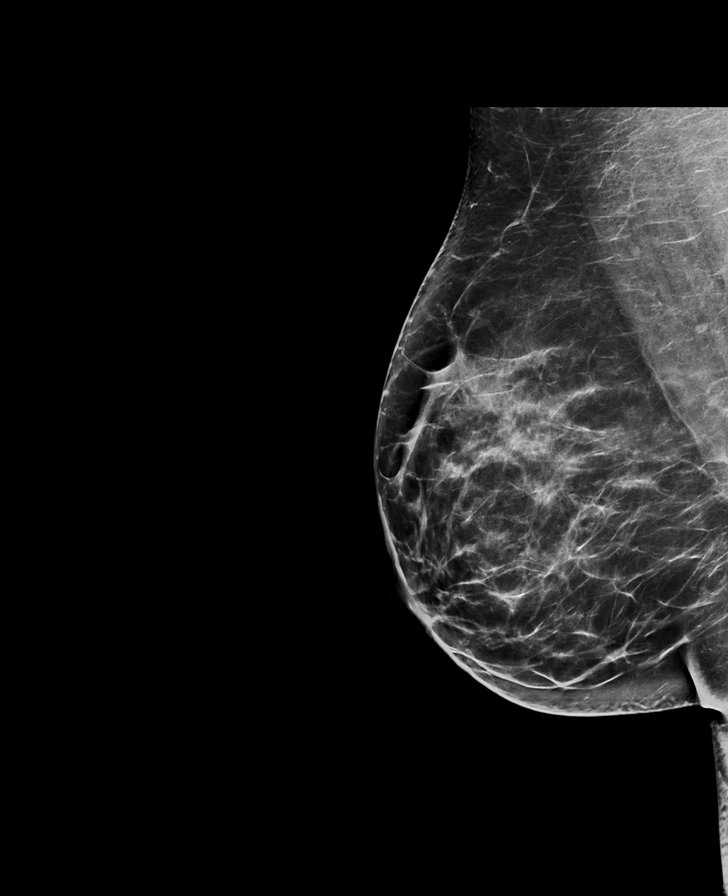

[L MLO synth-2D]
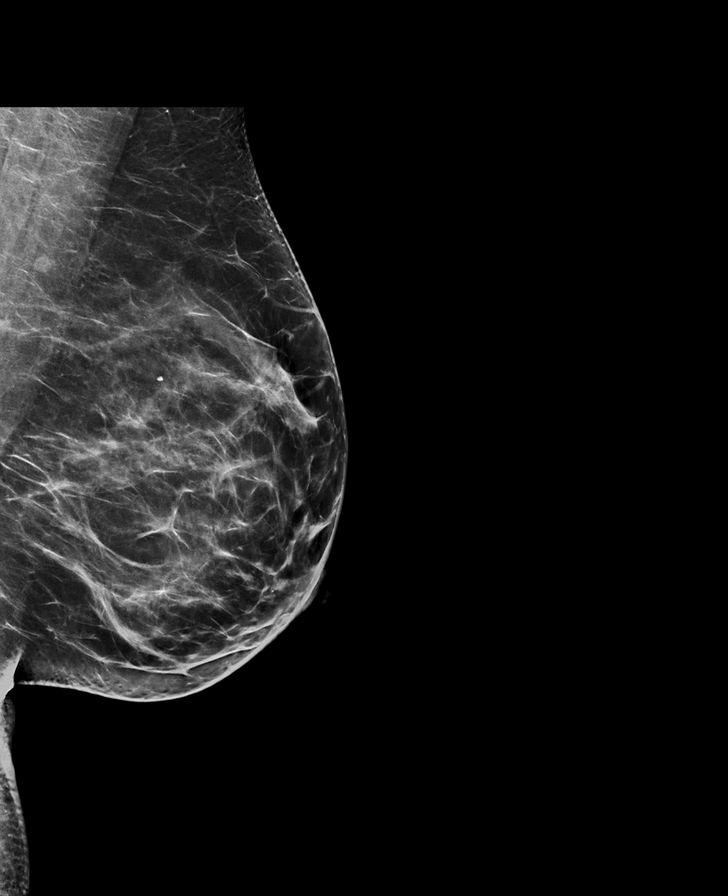

[R CC synth-2D]
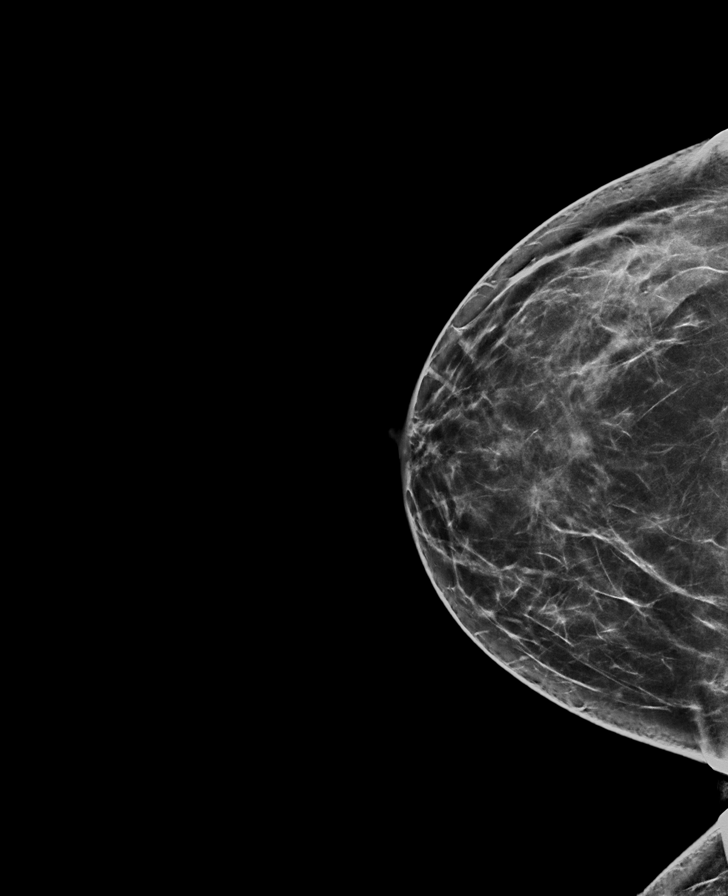

[L CC synth-2D]
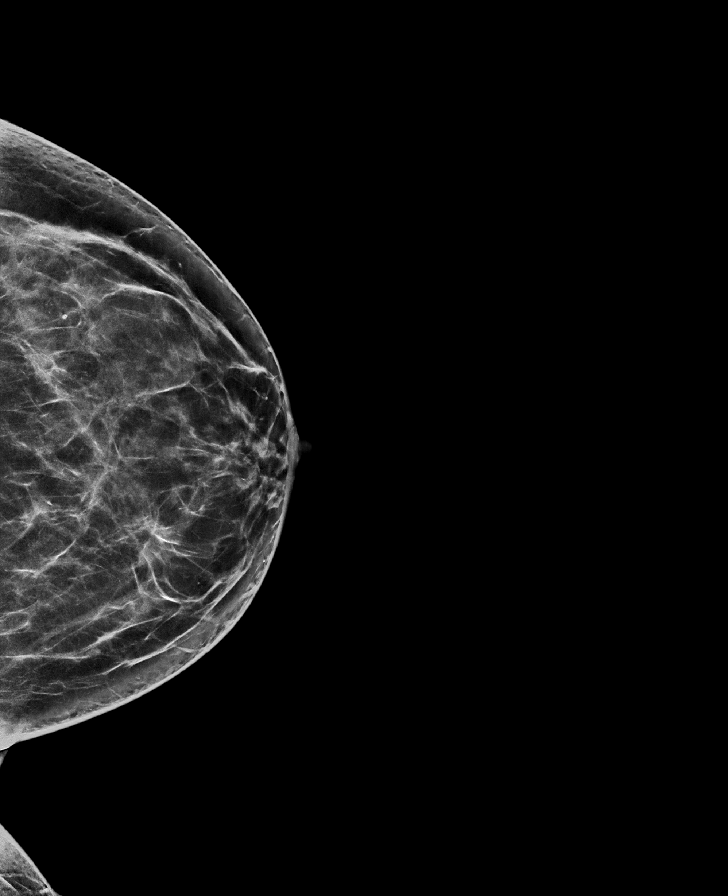

[L MLO tomo · tomo slice 43/84.0]
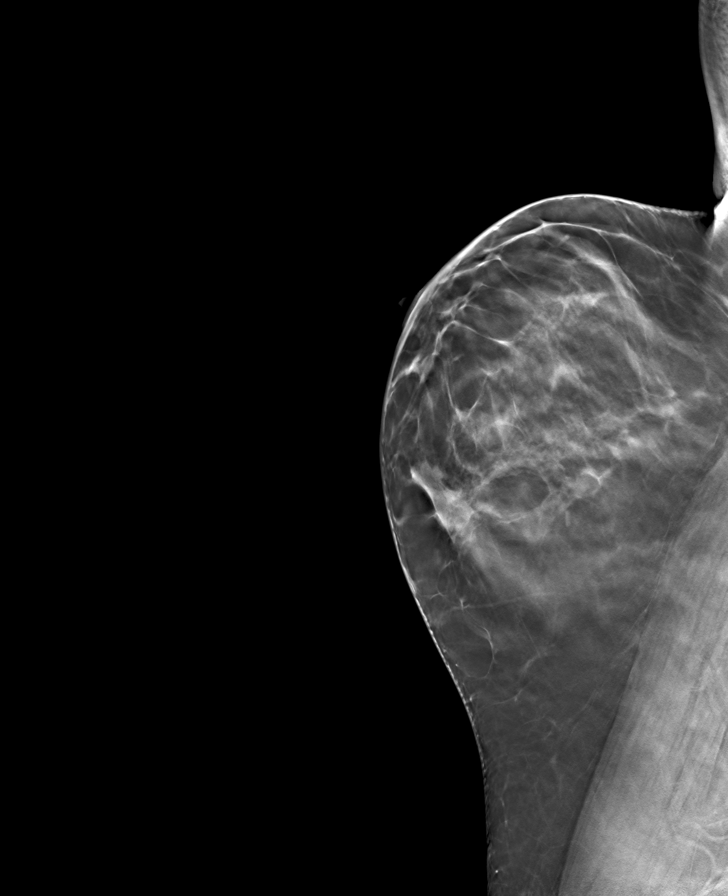

[L CC tomo · tomo slice 39/77.0]
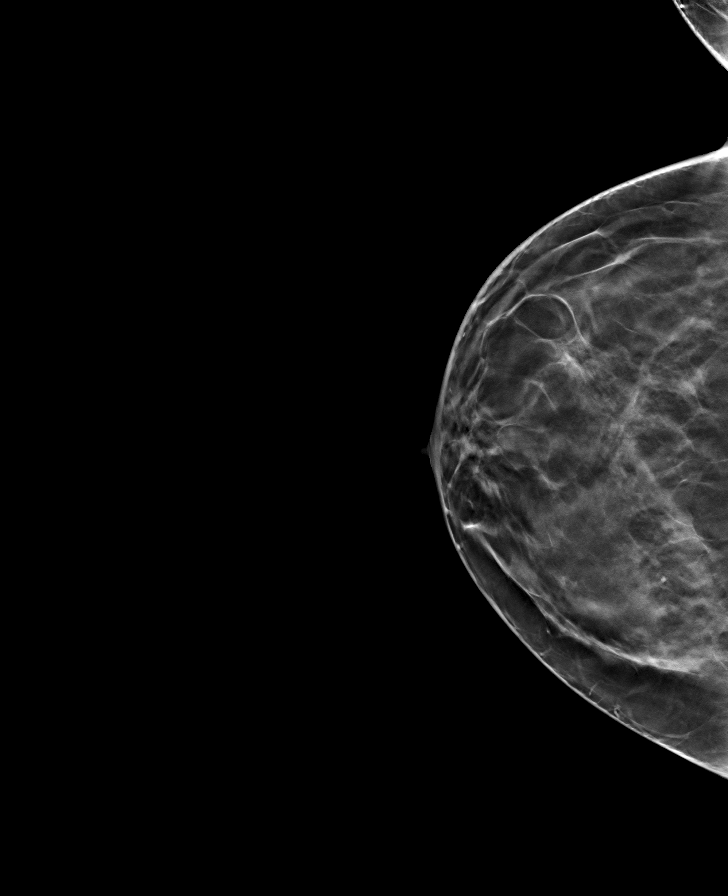

[R MLO tomo · tomo slice 45/88.0]
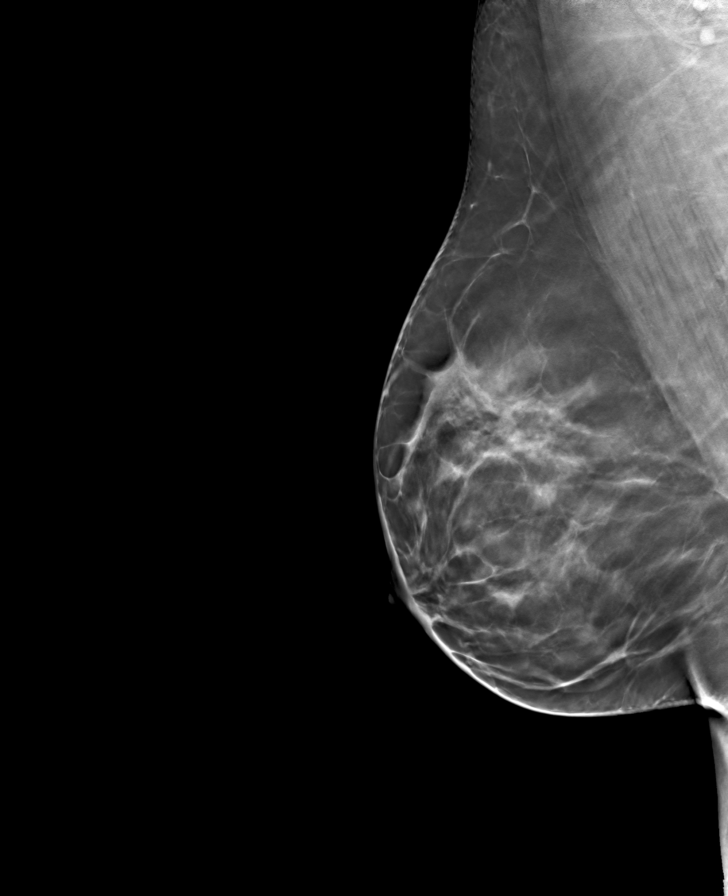

[R CC tomo · tomo slice 39/78.0]
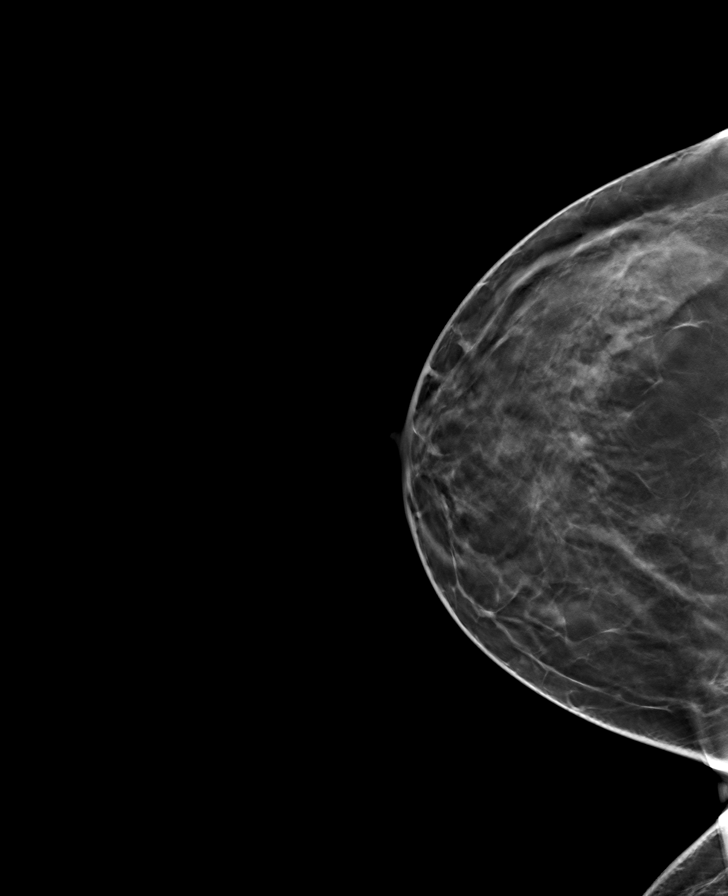

[8 of 24 positions shown; findings below may reference images not displayed]

ACR Breast Density Category c: The breast tissue is heterogeneously
dense, which may obscure small masses
FINDINGS: There are no findings suspicious for malignancy.
IMPRESSION: No mammographic evidence of malignancy. A result letter of this
screening mammogram will be mailed directly to the patient.

RECOMMENDATION:
Recommend initiation of annual high risk screening protocol. The
[employer] Society recommends annual MRI and mammography in
patients with an estimated lifetime risk of developing breast cancer
greater than 20 - 25%, or who are known or suspected to be positive
for the breast cancer gene.

BI-RADS CATEGORY  1: Negative.

## 2023-10-11 ENCOUNTER — Other Ambulatory Visit: Payer: Self-pay | Admitting: Internal Medicine

## 2023-10-11 DIAGNOSIS — Z1231 Encounter for screening mammogram for malignant neoplasm of breast: Secondary | ICD-10-CM

## 2023-10-14 ENCOUNTER — Other Ambulatory Visit: Payer: Self-pay | Admitting: Medical Genetics

## 2023-10-14 ENCOUNTER — Encounter (HOSPITAL_COMMUNITY): Payer: Self-pay

## 2023-10-21 ENCOUNTER — Ambulatory Visit
Admission: RE | Admit: 2023-10-21 | Discharge: 2023-10-21 | Disposition: A | Discharge status: Home / Self Care | Source: Ambulatory Visit | Attending: Internal Medicine | Admitting: Internal Medicine

## 2023-10-21 ENCOUNTER — Other Ambulatory Visit
Admission: RE | Admit: 2023-10-21 | Discharge: 2023-10-21 | Disposition: A | Discharge status: Home / Self Care | Payer: Self-pay | Source: Ambulatory Visit | Attending: Medical Genetics | Admitting: Medical Genetics

## 2023-10-21 DIAGNOSIS — Z1231 Encounter for screening mammogram for malignant neoplasm of breast: Secondary | ICD-10-CM | POA: Insufficient documentation

## 2023-11-02 LAB — GENECONNECT MOLECULAR SCREEN: Genetic Analysis Overall Interpretation: NEGATIVE
